# Patient Record
Sex: Male | Born: 1935 | Race: White | Hispanic: No | Smoking: Never smoker
Health system: Southern US, Community
[De-identification: ages and names within clinical notes are randomized; demographics above are authoritative.]

## PROBLEM LIST (undated history)

## (undated) DIAGNOSIS — N179 Acute kidney failure, unspecified: Secondary | ICD-10-CM

## (undated) DIAGNOSIS — I10 Essential (primary) hypertension: Secondary | ICD-10-CM

## (undated) DIAGNOSIS — C189 Malignant neoplasm of colon, unspecified: Secondary | ICD-10-CM

## (undated) DIAGNOSIS — C801 Malignant (primary) neoplasm, unspecified: Secondary | ICD-10-CM

## (undated) DIAGNOSIS — I5030 Unspecified diastolic (congestive) heart failure: Secondary | ICD-10-CM

## (undated) DIAGNOSIS — E7800 Pure hypercholesterolemia, unspecified: Secondary | ICD-10-CM

## (undated) HISTORY — DX: Unspecified diastolic (congestive) heart failure: I50.30

---

## 2003-05-11 HISTORY — PX: COLECTOMY: SHX59

## 2014-03-21 ENCOUNTER — Emergency Department (HOSPITAL_COMMUNITY): Payer: Medicare FFS

## 2014-03-21 ENCOUNTER — Encounter (HOSPITAL_COMMUNITY): Payer: Self-pay

## 2014-03-21 ENCOUNTER — Inpatient Hospital Stay (HOSPITAL_COMMUNITY)
Admission: EM | Admit: 2014-03-21 | Discharge: 2014-03-30 | DRG: 177 | Disposition: A | Discharge status: Skilled Nursing Facility | Payer: Medicare FFS | Attending: Internal Medicine | Admitting: Internal Medicine

## 2014-03-21 DIAGNOSIS — I615 Nontraumatic intracerebral hemorrhage, intraventricular: Secondary | ICD-10-CM | POA: Diagnosis not present

## 2014-03-21 DIAGNOSIS — K5900 Constipation, unspecified: Secondary | ICD-10-CM | POA: Diagnosis present

## 2014-03-21 DIAGNOSIS — R0602 Shortness of breath: Secondary | ICD-10-CM | POA: Diagnosis present

## 2014-03-21 DIAGNOSIS — J449 Chronic obstructive pulmonary disease, unspecified: Secondary | ICD-10-CM | POA: Diagnosis present

## 2014-03-21 DIAGNOSIS — Z85038 Personal history of other malignant neoplasm of large intestine: Secondary | ICD-10-CM

## 2014-03-21 DIAGNOSIS — D649 Anemia, unspecified: Secondary | ICD-10-CM | POA: Diagnosis present

## 2014-03-21 DIAGNOSIS — Z66 Do not resuscitate: Secondary | ICD-10-CM | POA: Diagnosis present

## 2014-03-21 DIAGNOSIS — F419 Anxiety disorder, unspecified: Secondary | ICD-10-CM | POA: Diagnosis present

## 2014-03-21 DIAGNOSIS — J90 Pleural effusion, not elsewhere classified: Secondary | ICD-10-CM

## 2014-03-21 DIAGNOSIS — J690 Pneumonitis due to inhalation of food and vomit: Secondary | ICD-10-CM | POA: Diagnosis present

## 2014-03-21 DIAGNOSIS — J9601 Acute respiratory failure with hypoxia: Secondary | ICD-10-CM | POA: Diagnosis present

## 2014-03-21 DIAGNOSIS — E876 Hypokalemia: Secondary | ICD-10-CM | POA: Diagnosis not present

## 2014-03-21 DIAGNOSIS — N183 Chronic kidney disease, stage 3 unspecified: Secondary | ICD-10-CM | POA: Diagnosis present

## 2014-03-21 DIAGNOSIS — I369 Nonrheumatic tricuspid valve disorder, unspecified: Secondary | ICD-10-CM

## 2014-03-21 DIAGNOSIS — I129 Hypertensive chronic kidney disease with stage 1 through stage 4 chronic kidney disease, or unspecified chronic kidney disease: Secondary | ICD-10-CM | POA: Diagnosis present

## 2014-03-21 DIAGNOSIS — D72829 Elevated white blood cell count, unspecified: Secondary | ICD-10-CM | POA: Diagnosis present

## 2014-03-21 DIAGNOSIS — E860 Dehydration: Secondary | ICD-10-CM | POA: Diagnosis present

## 2014-03-21 DIAGNOSIS — E780 Pure hypercholesterolemia: Secondary | ICD-10-CM | POA: Diagnosis present

## 2014-03-21 DIAGNOSIS — R531 Weakness: Secondary | ICD-10-CM

## 2014-03-21 DIAGNOSIS — I5033 Acute on chronic diastolic (congestive) heart failure: Secondary | ICD-10-CM | POA: Diagnosis present

## 2014-03-21 DIAGNOSIS — G9340 Encephalopathy, unspecified: Secondary | ICD-10-CM | POA: Diagnosis not present

## 2014-03-21 DIAGNOSIS — G8194 Hemiplegia, unspecified affecting left nondominant side: Secondary | ICD-10-CM | POA: Diagnosis present

## 2014-03-21 DIAGNOSIS — N19 Unspecified kidney failure: Secondary | ICD-10-CM

## 2014-03-21 DIAGNOSIS — Z9049 Acquired absence of other specified parts of digestive tract: Secondary | ICD-10-CM | POA: Diagnosis present

## 2014-03-21 DIAGNOSIS — D638 Anemia in other chronic diseases classified elsewhere: Secondary | ICD-10-CM | POA: Diagnosis present

## 2014-03-21 DIAGNOSIS — J189 Pneumonia, unspecified organism: Secondary | ICD-10-CM

## 2014-03-21 DIAGNOSIS — N179 Acute kidney failure, unspecified: Secondary | ICD-10-CM

## 2014-03-21 DIAGNOSIS — I10 Essential (primary) hypertension: Secondary | ICD-10-CM

## 2014-03-21 HISTORY — DX: Malignant neoplasm of colon, unspecified: C18.9

## 2014-03-21 HISTORY — DX: Essential (primary) hypertension: I10

## 2014-03-21 HISTORY — DX: Acute kidney failure, unspecified: N17.9

## 2014-03-21 HISTORY — DX: Pure hypercholesterolemia, unspecified: E78.00

## 2014-03-21 HISTORY — DX: Malignant (primary) neoplasm, unspecified: C80.1

## 2014-03-21 LAB — COMPREHENSIVE METABOLIC PANEL
ALT: 53 U/L (ref 0–53)
AST: 45 U/L — ABNORMAL HIGH (ref 0–37)
Albumin: 2.9 g/dL — ABNORMAL LOW (ref 3.5–5.2)
Alkaline Phosphatase: 237 U/L — ABNORMAL HIGH (ref 39–117)
Anion gap: 14 (ref 5–15)
BILIRUBIN TOTAL: 0.2 mg/dL — AB (ref 0.3–1.2)
BUN: 46 mg/dL — ABNORMAL HIGH (ref 6–23)
CHLORIDE: 103 meq/L (ref 96–112)
CO2: 26 meq/L (ref 19–32)
CREATININE: 2.56 mg/dL — AB (ref 0.50–1.35)
Calcium: 8.8 mg/dL (ref 8.4–10.5)
GFR, EST AFRICAN AMERICAN: 23 mL/min — AB (ref >90)
GFR, EST NON AFRICAN AMERICAN: 20 mL/min — AB (ref >90)
GLUCOSE: 144 mg/dL — AB (ref 70–99)
Potassium: 4.4 mEq/L (ref 3.7–5.3)
Sodium: 143 mEq/L (ref 137–147)
Total Protein: 7.2 g/dL (ref 6.0–8.3)

## 2014-03-21 LAB — TROPONIN I
Troponin I: <0.3 ng/mL (ref <0.30)
Troponin I: <0.3 ng/mL (ref <0.30)

## 2014-03-21 LAB — URINALYSIS, ROUTINE W REFLEX MICROSCOPIC
Bilirubin Urine: NEGATIVE
Glucose, UA: NEGATIVE mg/dL
Ketones, ur: NEGATIVE mg/dL
LEUKOCYTES UA: NEGATIVE
NITRITE: NEGATIVE
PROTEIN: 100 mg/dL — AB
SPECIFIC GRAVITY, URINE: 1.025 (ref 1.005–1.030)
UROBILINOGEN UA: 0.2 mg/dL (ref 0.0–1.0)
pH: 5.5 (ref 5.0–8.0)

## 2014-03-21 LAB — CBC WITH DIFFERENTIAL/PLATELET
BASOS ABS: 0.1 10*3/uL (ref 0.0–0.1)
Basophils Absolute: 0.1 10*3/uL (ref 0.0–0.1)
Basophils Relative: 0 % (ref 0–1)
Basophils Relative: 0 % (ref 0–1)
EOS ABS: 0 10*3/uL (ref 0.0–0.7)
EOS PCT: 0 % (ref 0–5)
EOS PCT: 0 % (ref 0–5)
Eosinophils Absolute: 0 10*3/uL (ref 0.0–0.7)
HCT: 36.5 % — ABNORMAL LOW (ref 39.0–52.0)
HCT: 35 % — ABNORMAL LOW (ref 39.0–52.0)
Hemoglobin: 11.4 g/dL — ABNORMAL LOW (ref 13.0–17.0)
Hemoglobin: 11 g/dL — ABNORMAL LOW (ref 13.0–17.0)
LYMPHS ABS: 1.1 10*3/uL (ref 0.7–4.0)
LYMPHS ABS: 0.5 10*3/uL — AB (ref 0.7–4.0)
LYMPHS PCT: 8 % — AB (ref 12–46)
Lymphocytes Relative: 4 % — ABNORMAL LOW (ref 12–46)
MCH: 25.1 pg — AB (ref 26.0–34.0)
MCH: 25.2 pg — AB (ref 26.0–34.0)
MCHC: 31.2 g/dL (ref 30.0–36.0)
MCHC: 31.4 g/dL (ref 30.0–36.0)
MCV: 80.2 fL (ref 78.0–100.0)
MCV: 80.3 fL (ref 78.0–100.0)
MONO ABS: 0.7 10*3/uL (ref 0.1–1.0)
Monocytes Absolute: 0.8 10*3/uL (ref 0.1–1.0)
Monocytes Relative: 5 % (ref 3–12)
Monocytes Relative: 6 % (ref 3–12)
NEUTROS PCT: 90 % — AB (ref 43–77)
Neutro Abs: 12.2 10*3/uL — ABNORMAL HIGH (ref 1.7–7.7)
Neutro Abs: 12.3 10*3/uL — ABNORMAL HIGH (ref 1.7–7.7)
Neutrophils Relative %: 87 % — ABNORMAL HIGH (ref 43–77)
PLATELETS: 358 10*3/uL (ref 150–400)
Platelets: 310 10*3/uL (ref 150–400)
RBC: 4.55 MIL/uL (ref 4.22–5.81)
RBC: 4.36 MIL/uL (ref 4.22–5.81)
RDW: 16.9 % — ABNORMAL HIGH (ref 11.5–15.5)
RDW: 17 % — AB (ref 11.5–15.5)
WBC: 14.1 10*3/uL — ABNORMAL HIGH (ref 4.0–10.5)
WBC: 13.6 10*3/uL — ABNORMAL HIGH (ref 4.0–10.5)

## 2014-03-21 LAB — URINE MICROSCOPIC-ADD ON

## 2014-03-21 LAB — BASIC METABOLIC PANEL
Anion gap: 14 (ref 5–15)
BUN: 46 mg/dL — ABNORMAL HIGH (ref 6–23)
CALCIUM: 8.9 mg/dL (ref 8.4–10.5)
CO2: 27 mEq/L (ref 19–32)
CREATININE: 2.56 mg/dL — AB (ref 0.50–1.35)
Chloride: 100 mEq/L (ref 96–112)
GFR calc Af Amer: 23 mL/min — ABNORMAL LOW (ref >90)
GFR calc non Af Amer: 20 mL/min — ABNORMAL LOW (ref >90)
GLUCOSE: 132 mg/dL — AB (ref 70–99)
Potassium: 4.6 mEq/L (ref 3.7–5.3)
Sodium: 141 mEq/L (ref 137–147)

## 2014-03-21 LAB — TSH: TSH: 4.02 u[IU]/mL (ref 0.350–4.500)

## 2014-03-21 LAB — PRO B NATRIURETIC PEPTIDE: PRO B NATRI PEPTIDE: 2695 pg/mL — AB (ref 0–450)

## 2014-03-21 LAB — HIV ANTIBODY (ROUTINE TESTING W REFLEX): HIV: NONREACTIVE

## 2014-03-21 LAB — STREP PNEUMONIAE URINARY ANTIGEN: STREP PNEUMO URINARY ANTIGEN: NEGATIVE

## 2014-03-21 MED ORDER — SERTRALINE HCL 50 MG PO TABS
50.0000 mg | ORAL_TABLET | Freq: Every day | ORAL | Status: DC
  Administered 2014-03-21 – 2014-03-30 (×10): 50 mg via ORAL
  Filled 2014-03-21 (×12): qty 1

## 2014-03-21 MED ORDER — DEXTROSE 5 % IV SOLN
500.0000 mg | INTRAVENOUS | Status: DC
  Administered 2014-03-22 – 2014-03-23 (×2): 500 mg via INTRAVENOUS
  Filled 2014-03-21 (×4): qty 500

## 2014-03-21 MED ORDER — NITROGLYCERIN 2 % TD OINT
1.0000 [in_us] | TOPICAL_OINTMENT | Freq: Once | TRANSDERMAL | Status: AC
  Administered 2014-03-21: 1 [in_us] via TOPICAL
  Filled 2014-03-21: qty 1

## 2014-03-21 MED ORDER — HEPARIN SODIUM (PORCINE) 5000 UNIT/ML IJ SOLN
5000.0000 [IU] | Freq: Three times a day (TID) | INTRAMUSCULAR | Status: DC
  Administered 2014-03-21 – 2014-03-27 (×20): 5000 [IU] via SUBCUTANEOUS
  Filled 2014-03-21 (×18): qty 1

## 2014-03-21 MED ORDER — LORATADINE 10 MG PO TABS
10.0000 mg | ORAL_TABLET | Freq: Every day | ORAL | Status: DC
Start: 2014-03-21 — End: 2014-03-30
  Administered 2014-03-21 – 2014-03-30 (×10): 10 mg via ORAL
  Filled 2014-03-21 (×10): qty 1

## 2014-03-21 MED ORDER — HYDROCHLOROTHIAZIDE 12.5 MG PO CAPS
12.5000 mg | ORAL_CAPSULE | Freq: Every day | ORAL | Status: DC
  Administered 2014-03-21 – 2014-03-23 (×3): 12.5 mg via ORAL
  Filled 2014-03-21 (×5): qty 1

## 2014-03-21 MED ORDER — LOSARTAN POTASSIUM-HCTZ 100-12.5 MG PO TABS: 1.0000 | ORAL_TABLET | Freq: Every day | ORAL | Status: DC

## 2014-03-21 MED ORDER — AZITHROMYCIN 500 MG IV SOLR
500.0000 mg | INTRAVENOUS | Status: DC
  Administered 2014-03-21: 500 mg via INTRAVENOUS

## 2014-03-21 MED ORDER — PANTOPRAZOLE SODIUM 40 MG PO TBEC
40.0000 mg | DELAYED_RELEASE_TABLET | Freq: Every day | ORAL | Status: DC
  Administered 2014-03-21 – 2014-03-30 (×10): 40 mg via ORAL
  Filled 2014-03-21 (×10): qty 1

## 2014-03-21 MED ORDER — DEXTROSE 5 % IV SOLN: 1.0000 g | INTRAVENOUS | Status: DC

## 2014-03-21 MED ORDER — DEXTROSE 5 % IV SOLN
INTRAVENOUS | Status: AC
  Filled 2014-03-21: qty 10

## 2014-03-21 MED ORDER — SODIUM CHLORIDE 0.9 % IV SOLN
250.0000 mL | INTRAVENOUS | Status: DC | PRN
Start: 2014-03-21 — End: 2014-03-30

## 2014-03-21 MED ORDER — IPRATROPIUM-ALBUTEROL 0.5-2.5 (3) MG/3ML IN SOLN
RESPIRATORY_TRACT | Status: AC
  Administered 2014-03-21: 3 mL
  Filled 2014-03-21: qty 3

## 2014-03-21 MED ORDER — LORAZEPAM 0.5 MG PO TABS
0.5000 mg | ORAL_TABLET | Freq: Once | ORAL | Status: AC | PRN
  Filled 2014-03-21 (×2): qty 1

## 2014-03-21 MED ORDER — IPRATROPIUM-ALBUTEROL 0.5-2.5 (3) MG/3ML IN SOLN
3.0000 mL | Freq: Four times a day (QID) | RESPIRATORY_TRACT | Status: DC
  Administered 2014-03-21 – 2014-03-25 (×14): 3 mL via RESPIRATORY_TRACT
  Filled 2014-03-21 (×14): qty 3

## 2014-03-21 MED ORDER — ATORVASTATIN CALCIUM 20 MG PO TABS: 20.0000 mg | ORAL_TABLET | Freq: Every day | ORAL | Status: DC

## 2014-03-21 MED ORDER — SODIUM CHLORIDE 0.9 % IJ SOLN: 3.0000 mL | INTRAMUSCULAR | Status: DC | PRN

## 2014-03-21 MED ORDER — SODIUM CHLORIDE 0.9 % IJ SOLN
3.0000 mL | Freq: Two times a day (BID) | INTRAMUSCULAR | Status: DC
  Administered 2014-03-22 – 2014-03-30 (×8): 3 mL via INTRAVENOUS

## 2014-03-21 MED ORDER — CETYLPYRIDINIUM CHLORIDE 0.05 % MT LIQD
7.0000 mL | Freq: Two times a day (BID) | OROMUCOSAL | Status: DC
  Administered 2014-03-21 – 2014-03-29 (×16): 7 mL via OROMUCOSAL

## 2014-03-21 MED ORDER — ASPIRIN 81 MG PO CHEW
81.0000 mg | CHEWABLE_TABLET | Freq: Every day | ORAL | Status: DC
Start: 2014-03-21 — End: 2014-03-27
  Administered 2014-03-21 – 2014-03-27 (×7): 81 mg via ORAL
  Filled 2014-03-21 (×7): qty 1

## 2014-03-21 MED ORDER — LORAZEPAM 0.5 MG PO TABS: 0.5000 mg | ORAL_TABLET | Freq: Once | ORAL | Status: DC

## 2014-03-21 MED ORDER — ALBUTEROL SULFATE (2.5 MG/3ML) 0.083% IN NEBU
INHALATION_SOLUTION | RESPIRATORY_TRACT | Status: AC
  Administered 2014-03-21: 2.5 mg
  Filled 2014-03-21: qty 3

## 2014-03-21 MED ORDER — GUAIFENESIN ER 600 MG PO TB12
600.0000 mg | ORAL_TABLET | Freq: Two times a day (BID) | ORAL | Status: DC
  Administered 2014-03-21 – 2014-03-30 (×19): 600 mg via ORAL
  Filled 2014-03-21 (×19): qty 1

## 2014-03-21 MED ORDER — DEXTROSE 5 % IV SOLN
500.0000 mg | INTRAVENOUS | Status: DC
  Administered 2014-03-21: 500 mg via INTRAVENOUS
  Filled 2014-03-21: qty 500

## 2014-03-21 MED ORDER — SODIUM CHLORIDE 0.9 % IJ SOLN
3.0000 mL | Freq: Two times a day (BID) | INTRAMUSCULAR | Status: DC
  Administered 2014-03-21 – 2014-03-26 (×5): 3 mL via INTRAVENOUS

## 2014-03-21 MED ORDER — DEXTROSE 5 % IV SOLN
1.0000 g | Freq: Every day | INTRAVENOUS | Status: DC
  Administered 2014-03-21 – 2014-03-23 (×3): 1 g via INTRAVENOUS
  Filled 2014-03-21 (×4): qty 10

## 2014-03-21 MED ORDER — ATORVASTATIN CALCIUM 20 MG PO TABS
20.0000 mg | ORAL_TABLET | Freq: Every day | ORAL | Status: DC
  Administered 2014-03-21 – 2014-03-29 (×9): 20 mg via ORAL
  Filled 2014-03-21 (×10): qty 1

## 2014-03-21 MED ORDER — FUROSEMIDE 10 MG/ML IJ SOLN
20.0000 mg | Freq: Once | INTRAMUSCULAR | Status: AC
  Administered 2014-03-21: 20 mg via INTRAVENOUS
  Filled 2014-03-21: qty 2

## 2014-03-21 MED ORDER — DEXTROSE 5 % IV SOLN: 1.0000 g | Freq: Once | INTRAVENOUS | Status: DC

## 2014-03-21 MED ORDER — LOSARTAN POTASSIUM 50 MG PO TABS
100.0000 mg | ORAL_TABLET | Freq: Every day | ORAL | Status: DC
  Administered 2014-03-21 – 2014-03-23 (×3): 100 mg via ORAL
  Filled 2014-03-21 (×5): qty 2

## 2014-03-21 NOTE — ED Notes (Signed)
Pt's son and daughter here. They state that he's only been any hospital 3 times in his life and that his feet have been swollen even more than they are now, "for years".

## 2014-03-21 NOTE — Progress Notes (Signed)
[facility] PROGRESS NOTE  Corey Cain IRS:[id number] DOB: Oct 29, 1914 DOA: 03/21/2014 PCP: No primary care provider on file.  Assessment/Plan: 1. Acute respiratory failure with hypoxia secondary to questionable pneumonia vs ? Chf.  He was admitted to telemetry and starte don IV antibiotics empirically for pneumonia. Cultures are pending. He is being evaluated for CHF with echocardiogram. Cardiac enzymes are negative and EKG does not show any ischemic changes. Continue nasal canula oxygen to keep sats>90%. Urine strep negative.   2. Renal failure/ ? Acute vs CKD: UA, and US renal ordered. A dose of lasix given earlier today. Would watchrenal parameters in am before giving another dose of lasix.    Hypertension controlled.   Leukocytosis: possibly from the pneumonia.   Mild anemia: Normocytic. Probably anemia of chronic disease.   Code Status: full code Family Communication: family at bedside Disposition Plan: pending.    Consultants:  none  Procedures:  echocardiogram  Antibiotics:  Rocephin 11/12  zithromax 11/12  HPI/Subjective: He feels comfortable and denies any chest pain or sob. Sleeping. Son at bedside  Objective: Filed Vitals:   03/21/14 1709  BP:   Pulse: 90  Temp:   Resp:     Intake/Output Summary (Last 24 hours) at 03/21/14 1725 Last data filed at 03/21/14 1200  Gross per 24 hour  Intake    240 ml  Output    600 ml  Net   -360 ml   Filed Weights   03/21/14 0500  Weight: 79.924 kg (176 lb 3.2 oz)    Exam:   General:  Alert afebrile comfortablesitting in the chair on 1 lit [place] oxygen.  Cardiovascular: s1s2  Respiratory: chest clear to auscultation , no wheezing heard.   Abdomen: soft non tender non distended bowel sounds heard  Musculoskeletal: pedal edema +1  Data Reviewed: Basic Metabolic Panel:  Recent Labs Lab 03/21/14 0228 03/21/14 0500  NA 141 143  K 4.6 4.4  CL 100 103  CO2 27 26  GLUCOSE 132* 144*  BUN 46*  46*  CREATININE 2.56* 2.56*  CALCIUM 8.9 8.8   Liver Function Tests:  Recent Labs Lab 03/21/14 0500  AST 45*  ALT 53  ALKPHOS 237*  BILITOT 0.2*  PROT 7.2  ALBUMIN 2.9*   No results for input(s): LIPASE, AMYLASE in the last 168 hours. No results for input(s): AMMONIA in the last 168 hours. CBC:  Recent Labs Lab 03/21/14 0228 03/21/14 0500  WBC 14.1* 13.6*  NEUTROABS 12.2* 12.3*  HGB 11.4* 11.0*  HCT 36.5* 35.0*  MCV 80.2 80.3  PLT 310 358   Cardiac Enzymes:  Recent Labs Lab 03/21/14 0228 03/21/14 0500 03/21/14 0936 03/21/14 1548  TROPONINI <0.30 <0.30 <0.30 <0.30   BNP (last 3 results)  Recent Labs  03/21/14 0228  PROBNP 2695.0*   CBG: No results for input(s): GLUCAP in the last 168 hours.  Recent Results (from the past 240 hour(s))  Culture, blood (routine x 2)     Status: None (Preliminary result)   Collection Time: 03/21/14  3:30 AM  Result Value Ref Range Status   Specimen Description BLOOD RIGHT ANTECUBITAL  Final   Special Requests BOTTLES DRAWN AEROBIC AND ANAEROBIC 10CC EACH  Final   Culture NO GROWTH <24 HRS  Final   Report Status PENDING  Incomplete  Culture, blood (routine x 2)     Status: None (Preliminary result)   Collection Time: 03/21/14  3:35 AM  Result Value Ref Range Status   Specimen Description BLOOD LEFT ANTECUBITAL  Final   Special Requests   Final    BOTTLES DRAWN AEROBIC AND ANAEROBIC AEB 4CC ANA 2CC   Culture NO GROWTH <24 HRS  Final   Report Status PENDING  Incomplete     Studies: Dg Chest 2 View  03/21/2014   CLINICAL DATA:  Cough for 1 week and congestion for 1 day. Shortness of breath and wheezing tonight.  EXAM: CHEST  2 VIEW  COMPARISON:  None.  FINDINGS: There is elevation of the right hemidiaphragm relative to the left. The patient has a small left pleural effusion with basilar atelectasis. The right lung is clear. No pneumothorax is identified. Heart size is upper normal. There is a fracture of the posterior  arc of the left seventh rib which is age indeterminate.  IMPRESSION: Small left pleural effusion and basilar airspace disease, likely atelectasis.  Age indeterminate left seventh rib fracture.   Electronically Signed   By: Inge Rise M.D.   On: 03/21/2014 03:04    Scheduled Meds: . antiseptic oral rinse  7 mL Mouth Rinse BID  . aspirin  81 mg Oral Daily  . atorvastatin  20 mg Oral q1800  . [START ON 03/22/2014] azithromycin  500 mg Intravenous Q24H  . cefTRIAXone (ROCEPHIN)  IV  1 g Intravenous Q0600  . guaiFENesin  600 mg Oral BID  . heparin  5,000 Units Subcutaneous 3 times per day  . losartan  100 mg Oral Daily   And  . hydrochlorothiazide  12.5 mg Oral Daily  . ipratropium-albuterol  3 mL Nebulization Q6H  . loratadine  10 mg Oral Daily  . pantoprazole  40 mg Oral Daily  . sertraline  50 mg Oral Daily  . sodium chloride  3 mL Intravenous Q12H  . sodium chloride  3 mL Intravenous Q12H   Continuous Infusions:   Active Problems:   Acute respiratory failure with hypoxia   AKI (acute kidney injury)    Time spent: 30 min    Margorie Renner  [facility] Pager [phone] If 7PM-7AM, please contact night-coverage at [email or web address], password [facility] 03/21/2014, 5:25 PM  LOS: 0 days

## 2014-03-21 NOTE — Progress Notes (Signed)
  Echocardiogram 2D Echocardiogram has been performed.  Corey Cain 03/21/2014, 12:19 PM

## 2014-03-21 NOTE — Progress Notes (Signed)
UR completed 

## 2014-03-21 NOTE — Progress Notes (Signed)
Patients telemetry monitor is now picking up properly.

## 2014-03-21 NOTE — Progress Notes (Signed)
Pt is experiencing agitation. Family states ativan does not work well for the patient. They have arranged for a personal sitter to be with pt all night and he seems to be more cooperative and calm.

## 2014-03-21 NOTE — ED Provider Notes (Signed)
CSN: [id number]     Arrival date & time 03/21/14  0202 History   First MD Initiated Contact with Patient 03/21/14 [phone]     Chief Complaint  Patient presents with  . Wheezing     (Consider location/radiation/quality/duration/timing/severity/associated sxs/prior Treatment) HPI  This is a very pleasant 80 year old male who presents by EMS with shortness of breath and cough. Patient denies any significant past medical history. Reports 5-6 days of worsening cough. Cough has become productive over the last day. Patient and family report that patient acutely worsened overnight. He became more short of breath when lying flat and his breathing was noted to be more labored. EMS was called. He was noted to be wheezing and was given a DuoNeb and round. No history of smoking or COPD.  Patient denies any recent fevers. No known history of heart failure or coronary artery disease.  Lower extremity edema is at patient's baseline per family report.  In discussions with family and patient, he wishes to be full code.  Primary doctor is Dr. Simonne Martinet.  Patient gets most of his medical care in [place] but family history physician in care to [place]. Past Medical History  Diagnosis Date  . Cancer   . Colon cancer approx 2005    colon resection with resolution   No past surgical history on file. No family history on file. History  Substance Use Topics  . Smoking status: Never Smoker   . Smokeless tobacco: Not on file  . Alcohol Use: No    Review of Systems  Constitutional: Negative.  Negative for fever.  Respiratory: Positive for cough and shortness of breath. Negative for chest tightness.   Cardiovascular: Positive for leg swelling. Negative for chest pain.  Gastrointestinal: Negative.  Negative for abdominal pain.  Genitourinary: Negative.  Negative for dysuria.  Musculoskeletal: Negative for back pain.  Neurological: Negative for headaches.  All other systems reviewed and are  negative.     Allergies  Review of patient's allergies indicates no known allergies.  Home Medications   Prior to Admission medications   Medication Sig Start Date End Date Taking? Authorizing Provider  aspirin 81 MG tablet Take 81 mg by mouth daily.   Yes Historical Provider, MD  atorvastatin (LIPITOR) 20 MG tablet Take 20 mg by mouth daily.   Yes Historical Provider, MD  benzonatate (TESSALON) 200 MG capsule Take 200 mg by mouth 3 (three) times daily as needed for cough.   Yes Historical Provider, MD  loratadine (CLARITIN) 10 MG tablet Take 10 mg by mouth daily.   Yes Historical Provider, MD  losartan-hydrochlorothiazide (HYZAAR) 100-12.5 MG per tablet Take 1 tablet by mouth daily.   Yes Historical Provider, MD  pantoprazole (PROTONIX) 40 MG tablet Take 40 mg by mouth daily.   Yes Historical Provider, MD  sertraline (ZOLOFT) 50 MG tablet Take 50 mg by mouth daily.   Yes Historical Provider, MD   BP 157/61 mmHg  Pulse 95  Temp(Src) 98.4 F (36.9 C) (Oral)  Resp 24  SpO2 95% Physical Exam  Constitutional: He is oriented to person, place, and time.  Appears younger than stated age  HENT:  Head: Normocephalic and atraumatic.  Mouth/Throat: Oropharynx is clear and moist.  Eyes: Pupils are equal, round, and reactive to light.  Cardiovascular: Normal rate, regular rhythm and normal heart sounds.   No murmur heard. Pulmonary/Chest: Effort normal. No respiratory distress. He has wheezes.  Fair air movement, diffuse expiratory wheezing, fine crackles at the bases  Abdominal: Soft. Bowel  sounds are normal. There is no tenderness. There is no rebound.  Musculoskeletal: He exhibits edema.  2+ bilateral lower extremity edema, symmetric  Neurological: He is alert and oriented to person, place, and time.  Skin: Skin is warm and dry.  Psychiatric: He has a normal mood and affect.  Nursing note and vitals reviewed.   ED Course  Procedures (including critical care time) Labs  Review Labs Reviewed  CBC WITH DIFFERENTIAL - Abnormal; Notable for the following:    WBC 14.1 (*)    Hemoglobin 11.4 (*)    HCT 36.5 (*)    MCH 25.1 (*)    RDW 16.9 (*)    Neutrophils Relative % 87 (*)    Neutro Abs 12.2 (*)    Lymphocytes Relative 8 (*)    All other components within normal limits  BASIC METABOLIC PANEL - Abnormal; Notable for the following:    Glucose, Bld 132 (*)    BUN 46 (*)    Creatinine, Ser 2.56 (*)    GFR calc non Af Amer 20 (*)    GFR calc Af Amer 23 (*)    All other components within normal limits  PRO B NATRIURETIC PEPTIDE - Abnormal; Notable for the following:    Pro B Natriuretic peptide (BNP) 2695.0 (*)    All other components within normal limits  CULTURE, BLOOD (ROUTINE X 2)  CULTURE, BLOOD (ROUTINE X 2)  TROPONIN I    Imaging Review Dg Chest 2 View  03/21/2014   CLINICAL DATA:  Cough for 1 week and congestion for 1 day. Shortness of breath and wheezing tonight.  EXAM: CHEST  2 VIEW  COMPARISON:  None.  FINDINGS: There is elevation of the right hemidiaphragm relative to the left. The patient has a small left pleural effusion with basilar atelectasis. The right lung is clear. No pneumothorax is identified. Heart size is upper normal. There is a fracture of the posterior arc of the left seventh rib which is age indeterminate.  IMPRESSION: Small left pleural effusion and basilar airspace disease, likely atelectasis.  Age indeterminate left seventh rib fracture.   Electronically Signed   By: Inge Rise M.D.   On: 03/21/2014 03:04     EKG Interpretation   Date/Time:  Thursday March 21 2014 02:11:17 EST Ventricular Rate:  91 PR Interval:  168 QRS Duration: 143 QT Interval:  438 QTC Calculation: 539 R Axis:   -71 Text Interpretation:  Sinus rhythm RBBB and LAFB No prior for comparison  Confirmed by Gerardo Territo  MD, Aleiah Mohammed ([id number]) on 03/21/2014 2:22:29 AM      MDM   Final diagnoses:  Shortness of breath  Pleural effusion  Acute  kidney injury   Patient presents with shortness of breath, cough, and wheezing. Acutely worsened overnight and symptoms associated with lying flat. Appears somewhat volume overloaded on exam with bilateral lower extremity edema. He is also wheezing with no history of COPD or tobacco use. No significant improvement with DuoNeb. Suspect volume overload as culprit of wheezing. Basic labwork obtained. X-ray shows small left pleural effusion and basilar airspace disease. Basic lab work notable for leukocytosis to 14, creatinine of 2.56 with unknown baseline, and BNP of 2695.  Troponin is negative. EKG shows right bundle branch block and left anterior fascicular block with no prior for comparison. Patient not currently having any chest pain.  Given leukocytosis and pleural effusion, will cover for community-acquired pneumonia with azithromycin and Rocephin. Blood culture sent. Patient also given 20 mg of IV  Lasix as I suspect volume overload/heart failure as the cause of patient's wheezing. Given patient's overall clinical picture, will admit for further workup. Suspect patient may need an echocardiogram to evaluate for heart failure. He will need close monitoring of kidney function as well if he needs further diuresis.  Discussed with Dr. Ernestina Patches. Appreciate his consult.   Merryl Hacker, MD 03/21/14 [phone]

## 2014-03-21 NOTE — H&P (Signed)
Hospitalist Admission History and Physical  Patient name: Corey Cain Medical record number: [id number] Date of birth: June 02, 1914 Age: 80 y.o. Gender: male  Primary Care Provider: No primary care provider on file.  Chief Complaint: acute resp failure w/ hypoxia, AKI   History of Present Illness:This is a 80 y.o. year old male with significant past medical history of colon ca s/p colectomy 2005, HTN presenting with acute resp failure w/ hypoxia. Per family, pt has had increased WOB, cough, wheezing at home over past 4-5 days. No fevers or chills. No nausea or vomiting. 1 sick contact with similar sxs. Cough mildly productive. Intermittent orthopnea. Trace edema. Pt primarily gets his care in [place]. Family is in process of transitioning care here. Family denies any hx/o CHF in the past. Denies excess salt intake.  On presentation to the ER t 98.4, HR 90s, resp 20s, BP 157/61, Satting low 90s/upper 80s on RA. > 95% on 2L. WBC 14.1, hgb 11.4, Cr 2.56, BUN 46.  Pro BNP 2600. EKG sinus rhythm w/ RBBB. CXR Small left pleural effusion and basilar airspace disease, likely atelectasis. Age indeterminate left seventh rib fracture. Given low dose lasix x1. Started on rocephin and azithromycin. Was given albuterol for ? Wheezing. No real change in resp status.    Assessment and Plan: Corey Cain is a 80 y.o. year old male presenting with Acute resp failure w/ hypoxia, AKI   Active Problems:   Acute respiratory failure with hypoxia   AKI (acute kidney injury)   1- Acute resp failure w/ hypoxia -? Multifactorial etiology (CHF vs. CAP) -low dose lasix x1 -follow UOP -2D ECHO  -+ leukocytosis -CAP treatment w/ rocephin and azithro -pan culture -cont supplemental O2 -tele bed   2- AKI  -unclear if this is baseline -family reports pt chronically dehydrated -f/u Cr s/p low diuresis -strict Is and Os and daily weights  -reasess  3- HTN -BP stable  -cont home regimen   FEN/GI: heart  healthy diet  Prophylaxis: sub q heparin  Disposition: pending further evaluation  Code Status:Full Code    Patient Active Problem List   Diagnosis Date Noted  . Acute respiratory failure with hypoxia 03/21/2014   Past Medical History: Past Medical History  Diagnosis Date  . Cancer   . Colon cancer approx 2005    colon resection with resolution    Past Surgical History: No past surgical history on file.  Social History: History   Social History  . Marital Status: Widowed    Spouse Name: N/A    Number of Children: N/A  . Years of Education: N/A   Social History Main Topics  . Smoking status: Never Smoker   . Smokeless tobacco: Not on file  . Alcohol Use: No  . Drug Use: Not on file  . Sexual Activity: Not on file   Other Topics Concern  . Not on file   Social History Narrative  . No narrative on file    Family History: No family history on file.  Allergies: No Known Allergies  Current Facility-Administered Medications  Medication Dose Route Frequency Provider Last Rate Last Dose  . 0.9 %  sodium chloride infusion  250 mL Intravenous PRN Shanda Howells, MD      . aspirin chewable tablet 81 mg  81 mg Oral Daily Shanda Howells, MD      . atorvastatin (LIPITOR) tablet 20 mg  20 mg Oral Daily Shanda Howells, MD      . azithromycin (ZITHROMAX) 500 mg in  dextrose 5 % 250 mL IVPB  500 mg Intravenous Q24H Shanda Howells, MD      . cefTRIAXone (ROCEPHIN) 1 g in dextrose 5 % 50 mL IVPB  1 g Intravenous Once Merryl Hacker, MD      . cefTRIAXone (ROCEPHIN) 1 g in dextrose 5 % 50 mL IVPB  1 g Intravenous Q24H Shanda Howells, MD      . furosemide (LASIX) injection 20 mg  20 mg Intravenous Once Merryl Hacker, MD      . heparin injection 5,000 Units  5,000 Units Subcutaneous 3 times per day Shanda Howells, MD      . loratadine (CLARITIN) tablet 10 mg  10 mg Oral Daily Shanda Howells, MD      . losartan-hydrochlorothiazide (HYZAAR) 100-12.5 MG per tablet 1 tablet  1 tablet  Oral Daily Shanda Howells, MD      . nitroGLYCERIN (NITROGLYN) 2 % ointment 1 inch  1 inch Topical Once Merryl Hacker, MD      . pantoprazole (PROTONIX) EC tablet 40 mg  40 mg Oral Daily Shanda Howells, MD      . sertraline (ZOLOFT) tablet 50 mg  50 mg Oral Daily Shanda Howells, MD      . sodium chloride 0.9 % injection 3 mL  3 mL Intravenous Q12H Shanda Howells, MD      . sodium chloride 0.9 % injection 3 mL  3 mL Intravenous Q12H Shanda Howells, MD      . sodium chloride 0.9 % injection 3 mL  3 mL Intravenous PRN Shanda Howells, MD       Current Outpatient Prescriptions  Medication Sig Dispense Refill  . aspirin 81 MG tablet Take 81 mg by mouth daily.    Marland Kitchen atorvastatin (LIPITOR) 20 MG tablet Take 20 mg by mouth daily.    . benzonatate (TESSALON) 200 MG capsule Take 200 mg by mouth 3 (three) times daily as needed for cough.    . loratadine (CLARITIN) 10 MG tablet Take 10 mg by mouth daily.    Marland Kitchen losartan-hydrochlorothiazide (HYZAAR) 100-12.5 MG per tablet Take 1 tablet by mouth daily.    . pantoprazole (PROTONIX) 40 MG tablet Take 40 mg by mouth daily.    . sertraline (ZOLOFT) 50 MG tablet Take 50 mg by mouth daily.     Review Of Systems: 12 point ROS negative except as noted above in HPI.  Physical Exam: Filed Vitals:   03/21/14 0212  BP: 157/61  Pulse: 95  Temp: 98.4 F (36.9 C)  Resp: 24    General: cooperative and mild distress HEENT: PERRLA and extra ocular movement intact Heart: S1, S2 normal, no murmur, rub or gallop, regular rate and rhythm Lungs: mild wheezes and rales  Abdomen: abdomen is soft without significant tenderness, masses, organomegaly or guarding Extremities: 2+ peripheral pulses, trace edema bilaterally Skin:no rashes, no ecchymoses Neurology: normal without focal findings  Labs and Imaging: Lab Results  Component Value Date/Time   NA 141 03/21/2014 02:28 AM   K 4.6 03/21/2014 02:28 AM   CL 100 03/21/2014 02:28 AM   CO2 27 03/21/2014 02:28 AM   BUN  46* 03/21/2014 02:28 AM   CREATININE 2.56* 03/21/2014 02:28 AM   GLUCOSE 132* 03/21/2014 02:28 AM   Lab Results  Component Value Date   WBC 14.1* 03/21/2014   HGB 11.4* 03/21/2014   HCT 36.5* 03/21/2014   MCV 80.2 03/21/2014   PLT 310 03/21/2014    Dg Chest 2 View  03/21/2014  CLINICAL DATA:  Cough for 1 week and congestion for 1 day. Shortness of breath and wheezing tonight.  EXAM: CHEST  2 VIEW  COMPARISON:  None.  FINDINGS: There is elevation of the right hemidiaphragm relative to the left. The patient has a small left pleural effusion with basilar atelectasis. The right lung is clear. No pneumothorax is identified. Heart size is upper normal. There is a fracture of the posterior arc of the left seventh rib which is age indeterminate.  IMPRESSION: Small left pleural effusion and basilar airspace disease, likely atelectasis.  Age indeterminate left seventh rib fracture.   Electronically Signed   By: Inge Rise M.D.   On: 03/21/2014 03:04           Shanda Howells MD  Pager: [phone]

## 2014-03-21 NOTE — Progress Notes (Signed)
Patients telemetry was set up initially and was being picked up on the monitors.  [facility] is currently having technical difficulties and patient is not picking up on the monitors.  Called [facility] and notified them, was assured that they were working on it.  Will continue to monitor the patient until problem is resolved.

## 2014-03-21 NOTE — Plan of Care (Signed)
Problem: Consults Goal: Heart Failure Patient Education (See Patient Education module for education specifics.)  Outcome: Progressing Goal: Skin Care Protocol Initiated - if Braden Score 18 or less If consults are not indicated, leave blank or document N/A  Outcome: Not Applicable Date Met:  53/79/43 Goal: Tobacco Cessation referral if indicated Outcome: Not Applicable Date Met:  27/61/47 Goal: Nutrition Consult-if indicated Outcome: Not Applicable Date Met:  02/06/56 Goal: Diabetes Guidelines if Diabetic/Glucose > 140 If diabetic or lab glucose is > 140 mg/dl - Initiate Diabetes/Hyperglycemia Guidelines & Document Interventions  Outcome: Not Applicable Date Met:  47/34/03

## 2014-03-21 NOTE — Care Management Note (Addendum)
    Page 1 of 1   03/29/2014     2:40:10 PM CARE MANAGEMENT NOTE 03/29/2014  Patient:  Corey Cain, Corey Cain   Account Number:  [id number]  Date Initiated:  03/21/2014  Documentation initiated by:  Theophilus Kinds  Subjective/Objective Assessment:   Pt admitted from home with respiratory failure. Pt lives alone but pts children take turns staying with pt. Pt also has some private duty sitters. Pt has a walker for home use.     Action/Plan:   PT consult placed. Will continue to follow for discharge planning needs.   Anticipated DC Date:  03/25/2014   Anticipated DC Plan:  [place]  CM consult      Choice offered to / List presented to:             Status of service:  In process, will continue to follow Medicare Important Message given?  YES (If response is "NO", the following Medicare IM given date fields will be blank) Date Medicare IM given:  03/22/2014 Medicare IM given by:  Christinia Gully C Date Additional Medicare IM given:  03/28/2014 Additional Medicare IM given by:  Theophilus Kinds  Discharge Disposition:    Per UR Regulation:    If discussed at Long Length of Stay Meetings, dates discussed:   03/28/2014    Comments:  03/29/14 [place], RN BSN CM Pts condition has stablized a little. The family has decided to hold off on Hospice at this time. Anticipate discharge to [facility] 04/08/2014. CSW to arrange discharge to facility.  03/28/14 [place], RN BSN CM Pt condition deteriorating. Family wants to speak with someone with [facility] [place]. Family is considering [facility]. Hospice to come speak with family at 3 pm today. CSW will arrange discharge to facility once family in agreement with plan.  03/22/14 [place], RN BSN CM Pt condition worsening and will transfer to stepdown. Will continue to follow for discharge planning needs.  03/21/14 [place], RN BSN CM

## 2014-03-21 NOTE — ED Notes (Signed)
Chest congestion for one week, cough worse today, wheezing tonight. Given neb by EMS

## 2014-03-22 ENCOUNTER — Inpatient Hospital Stay (HOSPITAL_COMMUNITY): Payer: Medicare FFS

## 2014-03-22 DIAGNOSIS — N183 Chronic kidney disease, stage 3 unspecified: Secondary | ICD-10-CM | POA: Diagnosis present

## 2014-03-22 DIAGNOSIS — D72829 Elevated white blood cell count, unspecified: Secondary | ICD-10-CM | POA: Diagnosis present

## 2014-03-22 DIAGNOSIS — J189 Pneumonia, unspecified organism: Secondary | ICD-10-CM | POA: Diagnosis present

## 2014-03-22 DIAGNOSIS — D649 Anemia, unspecified: Secondary | ICD-10-CM | POA: Diagnosis present

## 2014-03-22 LAB — CBC WITH DIFFERENTIAL/PLATELET
BASOS ABS: 0.1 10*3/uL (ref 0.0–0.1)
BASOS PCT: 0 % (ref 0–1)
Eosinophils Absolute: 0 10*3/uL (ref 0.0–0.7)
Eosinophils Relative: 0 % (ref 0–5)
HCT: 34.1 % — ABNORMAL LOW (ref 39.0–52.0)
Hemoglobin: 10.7 g/dL — ABNORMAL LOW (ref 13.0–17.0)
Lymphocytes Relative: 6 % — ABNORMAL LOW (ref 12–46)
Lymphs Abs: 0.7 10*3/uL (ref 0.7–4.0)
MCH: 24.9 pg — AB (ref 26.0–34.0)
MCHC: 31.4 g/dL (ref 30.0–36.0)
MCV: 79.5 fL (ref 78.0–100.0)
Monocytes Absolute: 0.7 10*3/uL (ref 0.1–1.0)
Monocytes Relative: 6 % (ref 3–12)
NEUTROS ABS: 10.6 10*3/uL — AB (ref 1.7–7.7)
NEUTROS PCT: 88 % — AB (ref 43–77)
PLATELETS: 313 10*3/uL (ref 150–400)
RBC: 4.29 MIL/uL (ref 4.22–5.81)
RDW: 17.1 % — ABNORMAL HIGH (ref 11.5–15.5)
WBC: 12.1 10*3/uL — ABNORMAL HIGH (ref 4.0–10.5)

## 2014-03-22 LAB — BLOOD GAS, ARTERIAL
Acid-Base Excess: 0.7 mmol/L (ref 0.0–2.0)
Bicarbonate: 25.3 mEq/L — ABNORMAL HIGH (ref 20.0–24.0)
DRAWN BY: 27407
O2 CONTENT: 3 L/min
O2 Saturation: 97.1 %
PCO2 ART: 43.9 mmHg (ref 35.0–45.0)
Patient temperature: 37
TCO2: 23.3 mmol/L (ref 0–100)
pH, Arterial: 7.378 (ref 7.350–7.450)
pO2, Arterial: 98.6 mmHg (ref 80.0–100.0)

## 2014-03-22 LAB — LEGIONELLA ANTIGEN, URINE

## 2014-03-22 LAB — COMPREHENSIVE METABOLIC PANEL
ALT: 43 U/L (ref 0–53)
ANION GAP: 15 (ref 5–15)
AST: 36 U/L (ref 0–37)
Albumin: 2.7 g/dL — ABNORMAL LOW (ref 3.5–5.2)
Alkaline Phosphatase: 218 U/L — ABNORMAL HIGH (ref 39–117)
BUN: 49 mg/dL — AB (ref 6–23)
CO2: 26 meq/L (ref 19–32)
Calcium: 8.7 mg/dL (ref 8.4–10.5)
Chloride: 102 mEq/L (ref 96–112)
Creatinine, Ser: 2.48 mg/dL — ABNORMAL HIGH (ref 0.50–1.35)
GFR, EST AFRICAN AMERICAN: 24 mL/min — AB (ref >90)
GFR, EST NON AFRICAN AMERICAN: 20 mL/min — AB (ref >90)
GLUCOSE: 105 mg/dL — AB (ref 70–99)
Potassium: 4.4 mEq/L (ref 3.7–5.3)
SODIUM: 143 meq/L (ref 137–147)
TOTAL PROTEIN: 6.9 g/dL (ref 6.0–8.3)
Total Bilirubin: 0.2 mg/dL — ABNORMAL LOW (ref 0.3–1.2)

## 2014-03-22 LAB — MRSA PCR SCREENING: MRSA BY PCR: NEGATIVE

## 2014-03-22 MED ORDER — METHYLPREDNISOLONE SODIUM SUCC 125 MG IJ SOLR
60.0000 mg | Freq: Two times a day (BID) | INTRAMUSCULAR | Status: DC
  Administered 2014-03-22 – 2014-03-23 (×2): 60 mg via INTRAVENOUS
  Filled 2014-03-22 (×2): qty 2

## 2014-03-22 MED ORDER — METHYLPREDNISOLONE SODIUM SUCC 125 MG IJ SOLR
60.0000 mg | Freq: Four times a day (QID) | INTRAMUSCULAR | Status: DC
  Administered 2014-03-22: 60 mg via INTRAVENOUS
  Filled 2014-03-22: qty 2

## 2014-03-22 MED ORDER — FUROSEMIDE 10 MG/ML IJ SOLN
40.0000 mg | Freq: Every day | INTRAMUSCULAR | Status: DC
  Administered 2014-03-22 – 2014-03-23 (×2): 40 mg via INTRAVENOUS
  Filled 2014-03-22 (×2): qty 4

## 2014-03-22 NOTE — Progress Notes (Signed)
[facility] PROGRESS NOTE  Corey Cain JEH:[id number] DOB: [birth year] DOA: 03/21/2014 PCP: No primary care provider on file.  Assessment/Plan: 1. Acute respiratory failure with hypoxia secondary to questionable pneumonia vs ? Chf.  He was admitted to telemetry and starte don IV antibiotics empirically for pneumonia. Cultures are pending. He is being evaluated for CHF with echocardiogram. Cardiac enzymes are negative and EKG does not show any ischemic changes. Echocardiogram shows grade 1 diastolic dysfunction and elevated pulmonary artery pressures.  Continue nasal canula oxygen to keep sats>90%. Urine strep negative. ABg looks okay. Plan to give lasix 40 mg daily and repeat CXR today.  Also started patient on IV solumedrol and duonebs.    Renal failure/ ? Acute vs CKD: UA, and US renal ordered. They are negative for infection and obstruction respectively.  Renal insufficiency probably CKD.   Hypertension controlled.   Leukocytosis: possibly from the pneumonia. Continue to monitor.   Mild anemia: Normocytic. Probably anemia of chronic disease.   Code Status: full code Family Communication: family at bedside Disposition Plan: transfer patient to stepdown.  Consultants:  none  Procedures:  echocardiogram  Antibiotics:  Rocephin 11/12  zithromax 11/12  HPI/Subjective: He is slightly restless, and audible wheezing heard. But he is alert and oriented.   Objective: Filed Vitals:   03/22/14 1152  BP:   Pulse:   Temp: 98.4 F (36.9 C)  Resp:     Intake/Output Summary (Last 24 hours) at 03/22/14 1406 Last data filed at 03/22/14 0900  Gross per 24 hour  Intake    120 ml  Output      0 ml  Net    120 ml   Filed Weights   03/21/14 0500 03/22/14 0502  Weight: 79.924 kg (176 lb 3.2 oz) 81.557 kg (179 lb 12.8 oz)    Exam:   General:  Alert afebrile comfortablesitting in the chair on 1 lit [place] oxygen.  Cardiovascular: s1s2  Respiratory: chest clear to  auscultation , no wheezing heard.   Abdomen: soft non tender non distended bowel sounds heard  Musculoskeletal: pedal edema +1  Data Reviewed: Basic Metabolic Panel:  Recent Labs Lab 03/21/14 0228 03/21/14 0500 03/22/14 0521  NA 141 143 143  K 4.6 4.4 4.4  CL 100 103 102  CO2 27 26 26   GLUCOSE 132* 144* 105*  BUN 46* 46* 49*  CREATININE 2.56* 2.56* 2.48*  CALCIUM 8.9 8.8 8.7   Liver Function Tests:  Recent Labs Lab 03/21/14 0500 03/22/14 0521  AST 45* 36  ALT 53 43  ALKPHOS 237* 218*  BILITOT 0.2* 0.2*  PROT 7.2 6.9  ALBUMIN 2.9* 2.7*   No results for input(s): LIPASE, AMYLASE in the last 168 hours. No results for input(s): AMMONIA in the last 168 hours. CBC:  Recent Labs Lab 03/21/14 0228 03/21/14 0500 03/22/14 0521  WBC 14.1* 13.6* 12.1*  NEUTROABS 12.2* 12.3* 10.6*  HGB 11.4* 11.0* 10.7*  HCT 36.5* 35.0* 34.1*  MCV 80.2 80.3 79.5  PLT 310 358 313   Cardiac Enzymes:  Recent Labs Lab 03/21/14 0228 03/21/14 0500 03/21/14 0936 03/21/14 1548  TROPONINI <0.30 <0.30 <0.30 <0.30   BNP (last 3 results)  Recent Labs  03/21/14 0228  PROBNP 2695.0*   CBG: No results for input(s): GLUCAP in the last 168 hours.  Recent Results (from the past 240 hour(s))  Culture, blood (routine x 2)     Status: None (Preliminary result)   Collection Time: 03/21/14  3:30 AM  Result Value Ref Range  Status   Specimen Description BLOOD RIGHT ANTECUBITAL  Final   Special Requests BOTTLES DRAWN AEROBIC AND ANAEROBIC 10CC EACH  Final   Culture NO GROWTH <24 HRS  Final   Report Status PENDING  Incomplete  Culture, blood (routine x 2)     Status: None (Preliminary result)   Collection Time: 03/21/14  3:35 AM  Result Value Ref Range Status   Specimen Description BLOOD LEFT ANTECUBITAL  Final   Special Requests   Final    BOTTLES DRAWN AEROBIC AND ANAEROBIC AEB 4CC ANA 2CC   Culture NO GROWTH <24 HRS  Final   Report Status PENDING  Incomplete     Studies: Dg Chest  2 View  03/21/2014   CLINICAL DATA:  Cough for 1 week and congestion for 1 day. Shortness of breath and wheezing tonight.  EXAM: CHEST  2 VIEW  COMPARISON:  None.  FINDINGS: There is elevation of the right hemidiaphragm relative to the left. The patient has a small left pleural effusion with basilar atelectasis. The right lung is clear. No pneumothorax is identified. Heart size is upper normal. There is a fracture of the posterior arc of the left seventh rib which is age indeterminate.  IMPRESSION: Small left pleural effusion and basilar airspace disease, likely atelectasis.  Age indeterminate left seventh rib fracture.   Electronically Signed   By: Inge Rise M.D.   On: 03/21/2014 03:04   US Renal  03/22/2014   CLINICAL DATA:  Renal failure.  EXAM: RENAL/URINARY TRACT ULTRASOUND COMPLETE  COMPARISON:  None.  FINDINGS: Right Kidney:  The right kidney is suboptimally evaluated secondary to poor sonographic window and diffuse cortical echogenicity. The right kidney appears normal in size measuring 13.3 cm in length. Evaluation for hydronephrosis is degraded secondary to poor sonographic window.  Left Kidney:  The left kidney is normal in size measuring 13.4 cm in length, however there is mild diffuse thinning of the left-sided renal cortical parenchyma with associated increased cortical echogenicity. Note is made of an exophytic slightly lobulated approximately 4.8 x 4.5 x 4.5 cm left-sided renal cyst. No evidence of left-sided urinary obstruction.  Bladder:  Appears normal for degree of bladder distention.  IMPRESSION: 1. Diffuse increased echogenicity of the bilateral renal parenchyma suggestive of medical renal disease. 2. No evidence of left-sided urinary obstruction. 3. Nondiagnostic evaluation for right-sided urinary obstruction secondary to poor sonographic window. Further evaluation could be performed with noncontrast abdominal CT as clinically indicated. 4. Incidentally noted approximately 4.8 cm  partially exophytic left-sided renal cyst.   Electronically Signed   By: Sandi Mariscal M.D.   On: 03/22/2014 09:46    Scheduled Meds: . antiseptic oral rinse  7 mL Mouth Rinse BID  . aspirin  81 mg Oral Daily  . atorvastatin  20 mg Oral q1800  . azithromycin  500 mg Intravenous Q24H  . cefTRIAXone (ROCEPHIN)  IV  1 g Intravenous Q0600  . furosemide  40 mg Intravenous Daily  . guaiFENesin  600 mg Oral BID  . heparin  5,000 Units Subcutaneous 3 times per day  . losartan  100 mg Oral Daily   And  . hydrochlorothiazide  12.5 mg Oral Daily  . ipratropium-albuterol  3 mL Nebulization Q6H  . loratadine  10 mg Oral Daily  . methylPREDNISolone (SOLU-MEDROL) injection  60 mg Intravenous Q12H  . pantoprazole  40 mg Oral Daily  . sertraline  50 mg Oral Daily  . sodium chloride  3 mL Intravenous Q12H  . sodium  chloride  3 mL Intravenous Q12H   Continuous Infusions:   Active Problems:   Acute respiratory failure with hypoxia   AKI (acute kidney injury)    Time spent: 30 min    Amedio Bowlby  [facility] Pager [phone] If 7PM-7AM, please contact night-coverage at [email or web address], password [facility] 03/22/2014, 2:06 PM  LOS: 1 day

## 2014-03-22 NOTE — Progress Notes (Signed)
Pt in bed. Head of bed up, O2 2L via [place]. Pt seems much more comfortable than before. Sitter at bedside. No sign of distress.

## 2014-03-22 NOTE — Progress Notes (Signed)
Pt's son states that the pt does not respond well with Ativan or Ambien and does not wish for the pt to receive drugs to reduce agitation.

## 2014-03-22 NOTE — Progress Notes (Signed)
Sitter states pt seems to be having difficulty breathing and asks for breathing treatment. Pt is laying flat in bed and head of bed is lifted. O2 2L via [place]. Pt's O2 sat is 94%. Pt states he feels better sitting up. Respiratory called. Will continue to monitor.

## 2014-03-22 NOTE — Progress Notes (Signed)
5 yoM started on IV Rocephin & Zithromax for CAP & acute kidney injury.   Rocephin & Zithromax do not require renal dose adjustment.  Continue doses as ordered.  Change to PO once appropriate.  Pharmacy to sign off.  Please re-consult if needed.   Netta Cedars, PharmD, BCPS 03/22/2014@8 :48 AM

## 2014-03-23 DIAGNOSIS — N183 Chronic kidney disease, stage 3 (moderate): Secondary | ICD-10-CM

## 2014-03-23 DIAGNOSIS — J189 Pneumonia, unspecified organism: Secondary | ICD-10-CM

## 2014-03-23 LAB — CBC WITH DIFFERENTIAL/PLATELET
Basophils Absolute: 0 10*3/uL (ref 0.0–0.1)
Basophils Relative: 0 % (ref 0–1)
Eosinophils Absolute: 0 10*3/uL (ref 0.0–0.7)
Eosinophils Relative: 0 % (ref 0–5)
HCT: 34.3 % — ABNORMAL LOW (ref 39.0–52.0)
Hemoglobin: 10.6 g/dL — ABNORMAL LOW (ref 13.0–17.0)
LYMPHS PCT: 3 % — AB (ref 12–46)
Lymphs Abs: 0.3 10*3/uL — ABNORMAL LOW (ref 0.7–4.0)
MCH: 24.6 pg — ABNORMAL LOW (ref 26.0–34.0)
MCHC: 30.9 g/dL (ref 30.0–36.0)
MCV: 79.6 fL (ref 78.0–100.0)
MONO ABS: 0.1 10*3/uL (ref 0.1–1.0)
Monocytes Relative: 1 % — ABNORMAL LOW (ref 3–12)
Neutro Abs: 11.4 10*3/uL — ABNORMAL HIGH (ref 1.7–7.7)
Neutrophils Relative %: 96 % — ABNORMAL HIGH (ref 43–77)
Platelets: 335 10*3/uL (ref 150–400)
RBC: 4.31 MIL/uL (ref 4.22–5.81)
RDW: 16.8 % — AB (ref 11.5–15.5)
WBC: 11.9 10*3/uL — AB (ref 4.0–10.5)

## 2014-03-23 LAB — BASIC METABOLIC PANEL
ANION GAP: 16 — AB (ref 5–15)
BUN: 62 mg/dL — ABNORMAL HIGH (ref 6–23)
CHLORIDE: 100 meq/L (ref 96–112)
CO2: 26 meq/L (ref 19–32)
CREATININE: 2.63 mg/dL — AB (ref 0.50–1.35)
Calcium: 8.5 mg/dL (ref 8.4–10.5)
GFR calc Af Amer: 22 mL/min — ABNORMAL LOW (ref >90)
GFR calc non Af Amer: 19 mL/min — ABNORMAL LOW (ref >90)
Glucose, Bld: 144 mg/dL — ABNORMAL HIGH (ref 70–99)
Potassium: 4.3 mEq/L (ref 3.7–5.3)
SODIUM: 142 meq/L (ref 137–147)

## 2014-03-23 MED ORDER — AZITHROMYCIN 250 MG PO TABS: 500.0000 mg | ORAL_TABLET | Freq: Every day | ORAL | Status: DC

## 2014-03-23 MED ORDER — PIPERACILLIN-TAZOBACTAM IN DEX 2-0.25 GM/50ML IV SOLN
2.2500 g | Freq: Three times a day (TID) | INTRAVENOUS | Status: DC
  Administered 2014-03-23 – 2014-03-29 (×18): 2.25 g via INTRAVENOUS
  Filled 2014-03-23 (×4): qty 50
  Filled 2014-03-23: qty 0
  Filled 2014-03-23 (×15): qty 50
  Filled 2014-03-23: qty 45
  Filled 2014-03-23 (×8): qty 50

## 2014-03-23 MED ORDER — VANCOMYCIN HCL IN DEXTROSE 1-5 GM/200ML-% IV SOLN
1000.0000 mg | INTRAVENOUS | Status: DC
  Administered 2014-03-23: 1000 mg via INTRAVENOUS
  Filled 2014-03-23: qty 200

## 2014-03-23 MED ORDER — PIPERACILLIN-TAZOBACTAM IN DEX 2-0.25 GM/50ML IV SOLN
INTRAVENOUS | Status: AC
  Filled 2014-03-23: qty 100

## 2014-03-23 MED ORDER — PREDNISONE 20 MG PO TABS
40.0000 mg | ORAL_TABLET | Freq: Every day | ORAL | Status: AC
  Administered 2014-03-24 – 2014-03-26 (×3): 40 mg via ORAL
  Filled 2014-03-23 (×3): qty 2

## 2014-03-23 NOTE — Progress Notes (Signed)
PHARMACIST - PHYSICIAN COMMUNICATION DR:  Corey Cain CONCERNING: Antibiotic IV to Oral Route Change Policy  RECOMMENDATION: This patient is receiving Zithromax by the intravenous route.  Based on criteria approved by the Pharmacy and Therapeutics Committee, the antibiotic(s) is/are being converted to the equivalent oral dose form(s).   DESCRIPTION: These criteria include:  Patient being treated for a respiratory tract infection, urinary tract infection, cellulitis or clostridium difficile associated diarrhea if on metronidazole  The patient is not neutropenic and does not exhibit a GI malabsorption state  The patient is eating (either orally or via tube) and/or has been taking other orally administered medications for a least 24 hours  The patient is improving clinically and has a Tmax < 100.5  If you have questions about this conversion, please contact the Pharmacy Department  [x]   [phone] )  Corey Cain []   [phone] )  Corey Cain  []   [phone] )  [facility] []   [phone] )  [place], PharmD, BCPS 03/23/2014@11 :24 AM

## 2014-03-23 NOTE — Progress Notes (Signed)
[facility] PROGRESS NOTE  Corey Cain JGO:[id number] DOB: 10-07-1914 DOA: 03/21/2014 PCP: No primary care provider on file.  Assessment/Plan: 1. Acute respiratory failure with hypoxia secondary to  pneumonia and diastolic Chf.  He was admitted to telemetry and starte don IV antibiotics empirically for pneumonia. Cultures are pending. He is being evaluated for CHF with echocardiogram. Cardiac enzymes are negative and EKG does not show any ischemic changes. Echocardiogram shows grade 1 diastolic dysfunction and elevated pulmonary artery pressures.  Continue nasal canula oxygen to keep sats>90%. Urine strep negative. ABg looks okay. One dose of IV lasix given and repeat CXR shows icnresing edema and congestive heart failure with increasing density in the right upper lobe concerning for aspiration pneumonia. Will change antibiotics to iv zosyn and vancomycin and get swallow evaluation.  He is not wheezing anymore, transition to oral steroids.    Renal failure/ ? Acute vs CKD: UA, and US renal ordered. They are negative for infection and obstruction respectively.  Renal insufficiency probably CKD.  Slight worsening of the renal function after the lasix. Will hold the lasix tomorrow and repeat BMP tonight.   Hypertension controlled.   Leukocytosis: possibly from the pneumonia. Improving. Continue to monitor.   Mild anemia: Normocytic. Probably anemia of chronic disease.   Code Status: full code Family Communication: family at bedside Disposition Plan: remain in stepdown.   Consultants: Physical therapy Speech eval.  Procedures:  echocardiogram  Antibiotics:  Rocephin 11/12  zithromax 11/12  HPI/Subjective:  he is alert and oriented. Able to answer questions.   Objective: Filed Vitals:   03/23/14 1152  BP:   Pulse:   Temp: 97.4 F (36.3 C)  Resp:     Intake/Output Summary (Last 24 hours) at 03/23/14 1414 Last data filed at 03/23/14 1338  Gross per 24 hour   Intake    650 ml  Output   1120 ml  Net   -470 ml   Filed Weights   03/21/14 0500 03/22/14 0502 03/23/14 0500  Weight: 79.924 kg (176 lb 3.2 oz) 81.557 kg (179 lb 12.8 oz) 80.8 kg (178 lb 2.1 oz)    Exam:   General:  Alert afebrile comfortable laying the bed on 3 lit [place] oxygen.  Cardiovascular: s1s2  Respiratory: chest clear to auscultation , no wheezing heard.   Abdomen: soft non tender non distended bowel sounds heard  Musculoskeletal: pedal edema +1  Data Reviewed: Basic Metabolic Panel:  Recent Labs Lab 03/21/14 0228 03/21/14 0500 03/22/14 0521 03/23/14 0500  NA 141 143 143 142  K 4.6 4.4 4.4 4.3  CL 100 103 102 100  CO2 27 26 26 26   GLUCOSE 132* 144* 105* 144*  BUN 46* 46* 49* 62*  CREATININE 2.56* 2.56* 2.48* 2.63*  CALCIUM 8.9 8.8 8.7 8.5   Liver Function Tests:  Recent Labs Lab 03/21/14 0500 03/22/14 0521  AST 45* 36  ALT 53 43  ALKPHOS 237* 218*  BILITOT 0.2* 0.2*  PROT 7.2 6.9  ALBUMIN 2.9* 2.7*   No results for input(s): LIPASE, AMYLASE in the last 168 hours. No results for input(s): AMMONIA in the last 168 hours. CBC:  Recent Labs Lab 03/21/14 0228 03/21/14 0500 03/22/14 0521 03/23/14 0500  WBC 14.1* 13.6* 12.1* 11.9*  NEUTROABS 12.2* 12.3* 10.6* 11.4*  HGB 11.4* 11.0* 10.7* 10.6*  HCT 36.5* 35.0* 34.1* 34.3*  MCV 80.2 80.3 79.5 79.6  PLT 310 358 313 335   Cardiac Enzymes:  Recent Labs Lab 03/21/14 0228 03/21/14 0500 03/21/14 0936 03/21/14  [place] <0.30 <0.30 <0.30 <0.30   BNP (last 3 results)  Recent Labs  03/21/14 0228  PROBNP 2695.0*   CBG: No results for input(s): GLUCAP in the last 168 hours.  Recent Results (from the past 240 hour(s))  Culture, blood (routine x 2)     Status: None (Preliminary result)   Collection Time: 03/21/14  3:30 AM  Result Value Ref Range Status   Specimen Description BLOOD RIGHT ANTECUBITAL  Final   Special Requests BOTTLES DRAWN AEROBIC AND ANAEROBIC 10CC EACH  Final    Culture NO GROWTH 2 DAYS  Final   Report Status PENDING  Incomplete  Culture, blood (routine x 2)     Status: None (Preliminary result)   Collection Time: 03/21/14  3:35 AM  Result Value Ref Range Status   Specimen Description BLOOD LEFT ANTECUBITAL  Final   Special Requests   Final    BOTTLES DRAWN AEROBIC AND ANAEROBIC AEB=4CC ANA=2CC   Culture NO GROWTH 2 DAYS  Final   Report Status PENDING  Incomplete  MRSA PCR Screening     Status: None   Collection Time: 03/22/14 11:33 AM  Result Value Ref Range Status   MRSA by PCR NEGATIVE NEGATIVE Final    Comment:        The GeneXpert MRSA Assay (FDA approved for NASAL specimens only), is one component of a comprehensive MRSA colonization surveillance program. It is not intended to diagnose MRSA infection nor to guide or monitor treatment for MRSA infections.      Studies: US Renal  03/22/2014   CLINICAL DATA:  Renal failure.  EXAM: RENAL/URINARY TRACT ULTRASOUND COMPLETE  COMPARISON:  None.  FINDINGS: Right Kidney:  The right kidney is suboptimally evaluated secondary to poor sonographic window and diffuse cortical echogenicity. The right kidney appears normal in size measuring 13.3 cm in length. Evaluation for hydronephrosis is degraded secondary to poor sonographic window.  Left Kidney:  The left kidney is normal in size measuring 13.4 cm in length, however there is mild diffuse thinning of the left-sided renal cortical parenchyma with associated increased cortical echogenicity. Note is made of an exophytic slightly lobulated approximately 4.8 x 4.5 x 4.5 cm left-sided renal cyst. No evidence of left-sided urinary obstruction.  Bladder:  Appears normal for degree of bladder distention.  IMPRESSION: 1. Diffuse increased echogenicity of the bilateral renal parenchyma suggestive of medical renal disease. 2. No evidence of left-sided urinary obstruction. 3. Nondiagnostic evaluation for right-sided urinary obstruction secondary to poor  sonographic window. Further evaluation could be performed with noncontrast abdominal CT as clinically indicated. 4. Incidentally noted approximately 4.8 cm partially exophytic left-sided renal cyst.   Electronically Signed   By: Sandi Mariscal M.D.   On: 03/22/2014 09:46   Dg Chest Port 1 View  03/22/2014   CLINICAL DATA:  Pneumonia.  EXAM: PORTABLE CHEST - 1 VIEW  COMPARISON:  Two-view chest 03/21/2014.  FINDINGS: The heart is mildly enlarged. Interstitial edema has increased. Right upper lobe consolidation is developing. Elevation of the right hemidiaphragm is again noted. Bilateral pleural effusions are again noted. Bibasilar airspace disease likely reflects atelectasis.  The left seventh rib fracture appears old. It is unchanged. There is no pneumothorax. Atherosclerotic calcifications are noted at the aortic arch.  IMPRESSION: 1. Increasing density in the right upper lobe concerning for developing infection or aspiration. 2. Increasing edema and probable congestive heart failure. 3. Bilateral pleural effusions. 4. Bibasilar airspace disease likely reflects atelectasis.   Electronically Signed   By:  Lawrence Santiago M.D.   On: 03/22/2014 14:14    Scheduled Meds: . antiseptic oral rinse  7 mL Mouth Rinse BID  . aspirin  81 mg Oral Daily  . atorvastatin  20 mg Oral q1800  . [START ON 03/24/2014] azithromycin  500 mg Oral Daily  . cefTRIAXone (ROCEPHIN)  IV  1 g Intravenous Q0600  . furosemide  40 mg Intravenous Daily  . guaiFENesin  600 mg Oral BID  . heparin  5,000 Units Subcutaneous 3 times per day  . ipratropium-albuterol  3 mL Nebulization Q6H  . loratadine  10 mg Oral Daily  . pantoprazole  40 mg Oral Daily  . [START ON 03/24/2014] predniSONE  40 mg Oral QAC breakfast  . sertraline  50 mg Oral Daily  . sodium chloride  3 mL Intravenous Q12H  . sodium chloride  3 mL Intravenous Q12H   Continuous Infusions:   Active Problems:   Acute respiratory failure with hypoxia   AKI (acute kidney  injury)   CKD (chronic kidney disease), stage III   CAP (community acquired pneumonia)   Anemia   Leukocytosis    Time spent: 30 min    Donavon Kimrey  [facility] Pager [phone] If 7PM-7AM, please contact night-coverage at [email or web address], password [facility] 03/23/2014, 2:14 PM  LOS: 2 days

## 2014-03-23 NOTE — Progress Notes (Signed)
ANTIBIOTIC CONSULT NOTE  Pharmacy Consult for Vancomycin & Zosyn Indication: pneumonia  No Known Allergies  Patient Measurements: Height: 5\' 6"  (167.6 cm) Weight: 178 lb 2.1 oz (80.8 kg) IBW/kg (Calculated) : 63.8  Vital Signs: Temp: 97.4 F (36.3 C) (11/14 1152) Temp Source: Oral (11/14 1152) BP: 145/50 mmHg (11/14 1000) Pulse Rate: 87 (11/14 1000) Intake/Output from previous day: 11/13 0701 - 11/14 0700 In: 270 [P.O.:120; IV Piggyback:150] Out: 620 [Urine:620] Intake/Output from this shift: Total I/O In: 500 [IV Piggyback:500] Out: 500 [Urine:500]  Labs:  Recent Labs  03/21/14 0500 03/22/14 0521 03/23/14 0500  WBC 13.6* 12.1* 11.9*  HGB 11.0* 10.7* 10.6*  PLT 358 313 335  CREATININE 2.56* 2.48* 2.63*   Estimated Creatinine Clearance: 16 mL/min (by C-G formula based on Cr of 2.63). No results for input(s): VANCOTROUGH, VANCOPEAK, VANCORANDOM, GENTTROUGH, GENTPEAK, GENTRANDOM, TOBRATROUGH, TOBRAPEAK, TOBRARND, AMIKACINPEAK, AMIKACINTROU, AMIKACIN in the last 72 hours.   Microbiology: Recent Results (from the past 720 hour(s))  Culture, blood (routine x 2)     Status: None (Preliminary result)   Collection Time: 03/21/14  3:30 AM  Result Value Ref Range Status   Specimen Description BLOOD RIGHT ANTECUBITAL  Final   Special Requests BOTTLES DRAWN AEROBIC AND ANAEROBIC 10CC EACH  Final   Culture NO GROWTH 2 DAYS  Final   Report Status PENDING  Incomplete  Culture, blood (routine x 2)     Status: None (Preliminary result)   Collection Time: 03/21/14  3:35 AM  Result Value Ref Range Status   Specimen Description BLOOD LEFT ANTECUBITAL  Final   Special Requests   Final    BOTTLES DRAWN AEROBIC AND ANAEROBIC AEB=4CC ANA=2CC   Culture NO GROWTH 2 DAYS  Final   Report Status PENDING  Incomplete  MRSA PCR Screening     Status: None   Collection Time: 03/22/14 11:33 AM  Result Value Ref Range Status   MRSA by PCR NEGATIVE NEGATIVE Final    Comment:        The  GeneXpert MRSA Assay (FDA approved for NASAL specimens only), is one component of a comprehensive MRSA colonization surveillance program. It is not intended to diagnose MRSA infection nor to guide or monitor treatment for MRSA infections.     Anti-infectives    Start     Dose/Rate Route Frequency Ordered Stop   03/24/14 1000  azithromycin (ZITHROMAX) tablet 500 mg  Status:  Discontinued     500 mg Oral Daily 03/23/14 1124 03/23/14 1415   03/22/14 0800  azithromycin (ZITHROMAX) 500 mg in dextrose 5 % 250 mL IVPB  Status:  Discontinued     500 mg250 mL/hr over 60 Minutes Intravenous Every 24 hours 03/21/14 0517 03/23/14 1124   03/21/14 0415  cefTRIAXone (ROCEPHIN) 1 g in dextrose 5 % 50 mL IVPB  Status:  Discontinued     1 g100 mL/hr over 30 Minutes Intravenous Daily 03/21/14 0402 03/23/14 1415   03/21/14 0400  cefTRIAXone (ROCEPHIN) 1 g in dextrose 5 % 50 mL IVPB  Status:  Discontinued     1 g100 mL/hr over 30 Minutes Intravenous Every 24 hours 03/21/14 0356 03/21/14 0400   03/21/14 0400  azithromycin (ZITHROMAX) 500 mg in dextrose 5 % 250 mL IVPB  Status:  Discontinued     500 mg250 mL/hr over 60 Minutes Intravenous Every 24 hours 03/21/14 0356 03/21/14 0518   03/21/14 0345  cefTRIAXone (ROCEPHIN) 1 g in dextrose 5 % 50 mL IVPB  Status:  Discontinued  1 g100 mL/hr over 30 Minutes Intravenous  Once 03/21/14 0330 03/21/14 0400   03/21/14 0345  azithromycin (ZITHROMAX) 500 mg in dextrose 5 % 250 mL IVPB  Status:  Discontinued     500 mg250 mL/hr over 60 Minutes Intravenous Every 24 hours 03/21/14 0330 03/21/14 0356      Assessment: 59 yoM admitted with acute respiratory failure with hypoxia secondary to PNA vs HF on 03/21/14.  He has been on Rocephin & Zithromax since admission, however CXR today reveals worsening density in RUL concerning for aspiration PNA.  Antibiotic coverage being broadened.   He remains afebrile.  WBC mildly elevated, but trending down since admission.   Acute  on chronic renal insufficiency noted.   Vancomycin 11/14>> Zosyn 11/14>> Rocephin 11/12>>11/14 Zithromax 11/12>>11/14  Goal of Therapy:  Vancomycin trough level 15-20 mcg/ml  Plan:  Zosyn 2.25gm IV q8h Vancomycin 1000mg  IV Q48h Check Vancomycin trough at steady state Monitor renal function and cx data   Biagio Borg 03/23/2014,2:35 PM

## 2014-03-24 ENCOUNTER — Inpatient Hospital Stay (HOSPITAL_COMMUNITY): Payer: Medicare FFS

## 2014-03-24 LAB — CBC WITH DIFFERENTIAL/PLATELET
Basophils Absolute: 0 10*3/uL (ref 0.0–0.1)
Basophils Relative: 0 % (ref 0–1)
Eosinophils Absolute: 0 10*3/uL (ref 0.0–0.7)
Eosinophils Relative: 0 % (ref 0–5)
HEMATOCRIT: 31.6 % — AB (ref 39.0–52.0)
HEMOGLOBIN: 10.3 g/dL — AB (ref 13.0–17.0)
LYMPHS PCT: 4 % — AB (ref 12–46)
Lymphs Abs: 0.7 10*3/uL (ref 0.7–4.0)
MCH: 25.6 pg — ABNORMAL LOW (ref 26.0–34.0)
MCHC: 32.6 g/dL (ref 30.0–36.0)
MCV: 78.4 fL (ref 78.0–100.0)
MONOS PCT: 7 % (ref 3–12)
Monocytes Absolute: 1.2 10*3/uL — ABNORMAL HIGH (ref 0.1–1.0)
Neutro Abs: 15.3 10*3/uL — ABNORMAL HIGH (ref 1.7–7.7)
Neutrophils Relative %: 89 % — ABNORMAL HIGH (ref 43–77)
Platelets: 331 10*3/uL (ref 150–400)
RBC: 4.03 MIL/uL — ABNORMAL LOW (ref 4.22–5.81)
RDW: 16.7 % — ABNORMAL HIGH (ref 11.5–15.5)
WBC: 17.3 10*3/uL — AB (ref 4.0–10.5)

## 2014-03-24 LAB — BASIC METABOLIC PANEL
ANION GAP: 15 (ref 5–15)
BUN: 77 mg/dL — AB (ref 6–23)
CHLORIDE: 100 meq/L (ref 96–112)
CO2: 26 meq/L (ref 19–32)
CREATININE: 2.95 mg/dL — AB (ref 0.50–1.35)
Calcium: 7.7 mg/dL — ABNORMAL LOW (ref 8.4–10.5)
GFR calc Af Amer: 19 mL/min — ABNORMAL LOW (ref >90)
GFR calc non Af Amer: 16 mL/min — ABNORMAL LOW (ref >90)
Glucose, Bld: 91 mg/dL (ref 70–99)
Potassium: 3.8 mEq/L (ref 3.7–5.3)
Sodium: 141 mEq/L (ref 137–147)

## 2014-03-24 LAB — SODIUM, URINE, RANDOM: SODIUM UR: 57 meq/L

## 2014-03-24 LAB — CREATININE, URINE, RANDOM: CREATININE, URINE: 73.67 mg/dL

## 2014-03-24 MED ORDER — DOCUSATE SODIUM 100 MG PO CAPS
100.0000 mg | ORAL_CAPSULE | Freq: Two times a day (BID) | ORAL | Status: DC
  Administered 2014-03-24 – 2014-03-25 (×3): 100 mg via ORAL
  Filled 2014-03-24 (×4): qty 1

## 2014-03-24 MED ORDER — SODIUM CHLORIDE 0.9 % IV SOLN
INTRAVENOUS | Status: DC
  Administered 2014-03-24: 10:00:00 via INTRAVENOUS

## 2014-03-24 MED ORDER — FUROSEMIDE 10 MG/ML IJ SOLN
60.0000 mg | Freq: Two times a day (BID) | INTRAMUSCULAR | Status: DC
  Administered 2014-03-24 – 2014-03-27 (×7): 60 mg via INTRAVENOUS
  Filled 2014-03-24 (×8): qty 6

## 2014-03-24 NOTE — Consult Note (Signed)
Reason for Consult:acute kidney injury Referring Physician: Dr. [name] is an 80 y.o. male.  HPI: he is a patient was long-standing history of hypertension, colon cancer status post colectomy 2005 and possibly chronic renal failure for the last 1 year presently was brought by his families because of cough, difficulty in breathing. When he was evaluated patient was found to be hypoxemic and admitted to ICU. Presently patient still has intermittent cough which seems to be better according to the patient and his son. His son told me his creatinine has been a little bit high but less than 2. Patient does not have any nausea or vomiting and appetite is good. There is no previous history of kidney stone.  Past Medical History  Diagnosis Date  . Cancer   . Colon cancer approx 2005    colon resection with resolution  . AKI (acute kidney injury) 03/21/2014    History reviewed. No pertinent past surgical history.  History reviewed. No pertinent family history.  Social History:  reports that he has never smoked. He does not have any smokeless tobacco history on file. He reports that he does not drink alcohol. His drug history is not on file.  Allergies: No Known Allergies  Medications: I have reviewed the patient'Cain current medications.  Results for orders placed or performed during the hospital encounter of 03/21/14 (from the past 48 hour(Cain))  MRSA PCR Screening     Status: None   Collection Time: 03/22/14 11:33 AM  Result Value Ref Range   MRSA by PCR NEGATIVE NEGATIVE    Comment:        The GeneXpert MRSA Assay (FDA approved for NASAL specimens only), is one component of a comprehensive MRSA colonization surveillance program. It is not intended to diagnose MRSA infection nor to guide or monitor treatment for MRSA infections.   Blood gas, arterial     Status: Abnormal   Collection Time: 03/22/14 11:40 AM  Result Value Ref Range   O2 Content 3.0 L/min   Delivery systems  NASAL CANNULA    pH, Arterial 7.378 7.350 - 7.450   pCO2 arterial 43.9 35.0 - 45.0 mmHg   pO2, Arterial 98.6 80.0 - 100.0 mmHg   Bicarbonate 25.3 (H) 20.0 - 24.0 mEq/L   TCO2 23.3 0 - 100 mmol/L   Acid-Base Excess 0.7 0.0 - 2.0 mmol/L   O2 Saturation 97.1 %   Patient temperature 37.0    Collection site RIGHT RADIAL    Drawn by [phone]    Sample type ARTERIAL DRAW    Allens test (pass/fail) PASS PASS  CBC WITH DIFFERENTIAL     Status: Abnormal   Collection Time: 03/23/14  5:00 AM  Result Value Ref Range   WBC 11.9 (H) 4.0 - 10.5 K/uL   RBC 4.31 4.22 - 5.81 MIL/uL   Hemoglobin 10.6 (L) 13.0 - 17.0 g/dL   HCT 34.3 (L) 39.0 - 52.0 %   MCV 79.6 78.0 - 100.0 fL   MCH 24.6 (L) 26.0 - 34.0 pg   MCHC 30.9 30.0 - 36.0 g/dL   RDW 16.8 (H) 11.5 - 15.5 %   Platelets 335 150 - 400 K/uL   Neutrophils Relative % 96 (H) 43 - 77 %   Neutro Abs 11.4 (H) 1.7 - 7.7 K/uL   Lymphocytes Relative 3 (L) 12 - 46 %   Lymphs Abs 0.3 (L) 0.7 - 4.0 K/uL   Monocytes Relative 1 (L) 3 - 12 %   Monocytes Absolute 0.1  0.1 - 1.0 K/uL   Eosinophils Relative 0 0 - 5 %   Eosinophils Absolute 0.0 0.0 - 0.7 K/uL   Basophils Relative 0 0 - 1 %   Basophils Absolute 0.0 0.0 - 0.1 K/uL  Basic metabolic panel     Status: Abnormal   Collection Time: 03/23/14  5:00 AM  Result Value Ref Range   Sodium 142 137 - 147 mEq/L   Potassium 4.3 3.7 - 5.3 mEq/L   Chloride 100 96 - 112 mEq/L   CO2 26 19 - 32 mEq/L   Glucose, Bld 144 (H) 70 - 99 mg/dL   BUN 62 (H) 6 - 23 mg/dL   Creatinine, Ser 2.63 (H) 0.50 - 1.35 mg/dL   Calcium 8.5 8.4 - 10.5 mg/dL   GFR calc non Af Amer 19 (L) >90 mL/min   GFR calc Af Amer 22 (L) >90 mL/min    Comment: (NOTE) The eGFR has been calculated using the CKD EPI equation. This calculation has not been validated in all clinical situations. eGFR'Cain persistently <90 mL/min signify possible Chronic Kidney Disease.    Anion gap 16 (H) 5 - 15  CBC WITH DIFFERENTIAL     Status: Abnormal   Collection  Time: 03/24/14  5:17 AM  Result Value Ref Range   WBC 17.3 (H) 4.0 - 10.5 K/uL   RBC 4.03 (L) 4.22 - 5.81 MIL/uL   Hemoglobin 10.3 (L) 13.0 - 17.0 g/dL   HCT 31.6 (L) 39.0 - 52.0 %   MCV 78.4 78.0 - 100.0 fL   MCH 25.6 (L) 26.0 - 34.0 pg   MCHC 32.6 30.0 - 36.0 g/dL   RDW 16.7 (H) 11.5 - 15.5 %   Platelets 331 150 - 400 K/uL   Neutrophils Relative % 89 (H) 43 - 77 %   Neutro Abs 15.3 (H) 1.7 - 7.7 K/uL   Lymphocytes Relative 4 (L) 12 - 46 %   Lymphs Abs 0.7 0.7 - 4.0 K/uL   Monocytes Relative 7 3 - 12 %   Monocytes Absolute 1.2 (H) 0.1 - 1.0 K/uL   Eosinophils Relative 0 0 - 5 %   Eosinophils Absolute 0.0 0.0 - 0.7 K/uL   Basophils Relative 0 0 - 1 %   Basophils Absolute 0.0 0.0 - 0.1 K/uL  Basic metabolic panel     Status: Abnormal   Collection Time: 03/24/14  5:17 AM  Result Value Ref Range   Sodium 141 137 - 147 mEq/L   Potassium 3.8 3.7 - 5.3 mEq/L   Chloride 100 96 - 112 mEq/L   CO2 26 19 - 32 mEq/L   Glucose, Bld 91 70 - 99 mg/dL   BUN 77 (H) 6 - 23 mg/dL   Creatinine, Ser 2.95 (H) 0.50 - 1.35 mg/dL   Calcium 7.7 (L) 8.4 - 10.5 mg/dL   GFR calc non Af Amer 16 (L) >90 mL/min   GFR calc Af Amer 19 (L) >90 mL/min    Comment: (NOTE) The eGFR has been calculated using the CKD EPI equation. This calculation has not been validated in all clinical situations. eGFR'Cain persistently <90 mL/min signify possible Chronic Kidney Disease.    Anion gap 15 5 - 15    US Renal  03/22/2014   CLINICAL DATA:  Renal failure.  EXAM: RENAL/URINARY TRACT ULTRASOUND COMPLETE  COMPARISON:  None.  FINDINGS: Right Kidney:  The right kidney is suboptimally evaluated secondary to poor sonographic window and diffuse cortical echogenicity. The right kidney appears normal in  size measuring 13.3 cm in length. Evaluation for hydronephrosis is degraded secondary to poor sonographic window.  Left Kidney:  The left kidney is normal in size measuring 13.4 cm in length, however there is mild diffuse thinning  of the left-sided renal cortical parenchyma with associated increased cortical echogenicity. Note is made of an exophytic slightly lobulated approximately 4.8 x 4.5 x 4.5 cm left-sided renal cyst. No evidence of left-sided urinary obstruction.  Bladder:  Appears normal for degree of bladder distention.  IMPRESSION: 1. Diffuse increased echogenicity of the bilateral renal parenchyma suggestive of medical renal disease. 2. No evidence of left-sided urinary obstruction. 3. Nondiagnostic evaluation for right-sided urinary obstruction secondary to poor sonographic window. Further evaluation could be performed with noncontrast abdominal CT as clinically indicated. 4. Incidentally noted approximately 4.8 cm partially exophytic left-sided renal cyst.   Electronically Signed   By: Sandi Mariscal M.D.   On: 03/22/2014 09:46   Dg Chest Port 1 View  03/22/2014   CLINICAL DATA:  Pneumonia.  EXAM: PORTABLE CHEST - 1 VIEW  COMPARISON:  Two-view chest 03/21/2014.  FINDINGS: The heart is mildly enlarged. Interstitial edema has increased. Right upper lobe consolidation is developing. Elevation of the right hemidiaphragm is again noted. Bilateral pleural effusions are again noted. Bibasilar airspace disease likely reflects atelectasis.  The left seventh rib fracture appears old. It is unchanged. There is no pneumothorax. Atherosclerotic calcifications are noted at the aortic arch.  IMPRESSION: 1. Increasing density in the right upper lobe concerning for developing infection or aspiration. 2. Increasing edema and probable congestive heart failure. 3. Bilateral pleural effusions. 4. Bibasilar airspace disease likely reflects atelectasis.   Electronically Signed   By: Lawrence Santiago M.D.   On: 03/22/2014 14:14    Review of Systems  HENT: Positive for congestion.   Respiratory: Positive for cough, sputum production and wheezing. Negative for shortness of breath.   Cardiovascular: Positive for leg swelling. Negative for orthopnea.   Gastrointestinal: Negative for nausea and vomiting.  Neurological: Positive for weakness.   Blood pressure 137/65, pulse 84, temperature 96.9 F (36.1 C), temperature source Axillary, resp. rate 31, height $RemoveBe'5\' 6"'YgyBSinZZ$  (1.676 m), weight 81.3 kg (179 lb 3.7 oz), SpO2 97 %. Physical Exam  Constitutional: No distress.  Eyes: No scleral icterus.  Neck: No JVD present.  Cardiovascular: Normal rate and regular rhythm.   No murmur heard. Respiratory: No respiratory distress. He has wheezes. He has no rales.  GI: He exhibits no distension. There is no tenderness. There is no rebound.  Musculoskeletal: He exhibits edema.  Neurological: He is alert.    Assessment/Plan: Problem #1 acute kidney injury possibly prerenal / ATN/vancomycin related kidney injury. His non-oliguric however his BUN and creatinine is increasing. According to his son patient used to have significant edema and has been taking diuretics on and off. Presently he looks much better. Problem #2 chronic renal failure: Possibly stage III. His creatinine was high but always below 2. His ultrasound showed bilaterally normal size kidney however with increased echogenicity. The etiology could be hypertension/age-related kidney function loss. Problem #3 difficulty breathing/cough possibly secondary to community  acquired pneumonia/bilateral pleural effusion/CHF.patient to oral antibiotics and seems to be getting better Problem #4 anemia Problem #5 hypertension: His blood pressure is reasonably controlled Problem #7 history of colonic cancer status post colectomy by history Plan: We'll start patient on normal saline at 75 mL per hour We'll start on Lasix 60 mg IV twice a day to improve his urine output We'll check his  basic metabolic panel in the morning.  If his renal function continued to get worse possibly we may need to stop his vancomycin. Corey Cain 03/24/2014, 9:02 AM

## 2014-03-24 NOTE — Progress Notes (Signed)
[facility] PROGRESS NOTE  Corey Cain BTD:[id number] DOB: October 27, 1914 DOA: 03/21/2014 PCP: No primary care provider on file.  Assessment/Plan: 1. Acute respiratory failure with hypoxia secondary to  pneumonia and diastolic Chf.  He was admitted to telemetry and starte don IV antibiotics empirically for pneumonia. Cultures are pending. Echocardiogram done and on strict intake and output. Cardiac enzymes are negative and EKG does not show any ischemic changes. Echocardiogram shows grade 1 diastolic dysfunction and elevated pulmonary artery pressures.  Continue nasal canula oxygen to keep sats>90%. Urine strep negative. ABg looks okay. Repeat CXR shows increasing edema and congestive heart failure with increasing density in the right upper lobe concerning for aspiration pneumonia. He is on IV lasix for diuresis and antibiotics changed to IV zosyn. Wean his off the oxygen,.  Watching renal parameters on IV lasix.    Renal failure/ ? Acute vs CKD: UA, and US renal ordered. They are negative for infection and obstruction respectively.  Renal insufficiency probably CKD stage 3..  Slight worsening of the renal function after the lasix. BMP shows worsening renal parameters. Renal consulted and recommendations given.   Hypertension   Better controlled.   Leukocytosis:  possibly from the pneumonia and steroids.Continue to monitor.   Mild anemia: Normocytic. Probably anemia of chronic disease.   Constipation: Colace added to the medications.   Code Status: full code Family Communication: family at bedside Disposition Plan: remain in stepdown.   Consultants: Physical therapy Speech eval.  Procedures:  echocardiogram  Antibiotics:  Rocephin 11/12  zithromax 11/12  HPI/Subjective:  he is alert and oriented. Able to answer questions. Wheezing has improved. Good apettite.   Objective: Filed Vitals:   03/24/14 1000  BP: 140/56  Pulse: 81  Temp:   Resp: 23     Intake/Output Summary (Last 24 hours) at 03/24/14 1046 Last data filed at 03/24/14 1023  Gross per 24 hour  Intake    350 ml  Output   1075 ml  Net   -725 ml   Filed Weights   03/22/14 0502 03/23/14 0500 03/24/14 0500  Weight: 81.557 kg (179 lb 12.8 oz) 80.8 kg (178 lb 2.1 oz) 81.3 kg (179 lb 3.7 oz)    Exam:   General:  Alert afebrile comfortable laying the bed on 2 lit [place] oxygen.  Cardiovascular: s1s2  Respiratory:scattered rales heard.   Abdomen: soft non tender non distended bowel sounds heard  Musculoskeletal: pedal edema +1  Data Reviewed: Basic Metabolic Panel:  Recent Labs Lab 03/21/14 0228 03/21/14 0500 03/22/14 0521 03/23/14 0500 03/24/14 0517  NA 141 143 143 142 141  K 4.6 4.4 4.4 4.3 3.8  CL 100 103 102 100 100  CO2 27 26 26 26 26   GLUCOSE 132* 144* 105* 144* 91  BUN 46* 46* 49* 62* 77*  CREATININE 2.56* 2.56* 2.48* 2.63* 2.95*  CALCIUM 8.9 8.8 8.7 8.5 7.7*   Liver Function Tests:  Recent Labs Lab 03/21/14 0500 03/22/14 0521  AST 45* 36  ALT 53 43  ALKPHOS 237* 218*  BILITOT 0.2* 0.2*  PROT 7.2 6.9  ALBUMIN 2.9* 2.7*   No results for input(s): LIPASE, AMYLASE in the last 168 hours. No results for input(s): AMMONIA in the last 168 hours. CBC:  Recent Labs Lab 03/21/14 0228 03/21/14 0500 03/22/14 0521 03/23/14 0500 03/24/14 0517  WBC 14.1* 13.6* 12.1* 11.9* 17.3*  NEUTROABS 12.2* 12.3* 10.6* 11.4* 15.3*  HGB 11.4* 11.0* 10.7* 10.6* 10.3*  HCT 36.5* 35.0* 34.1* 34.3* 31.6*  MCV 80.2 80.3  79.5 79.6 78.4  PLT 310 358 313 335 331   Cardiac Enzymes:  Recent Labs Lab 03/21/14 0228 03/21/14 0500 03/21/14 0936 03/21/14 1548  TROPONINI <0.30 <0.30 <0.30 <0.30   BNP (last 3 results)  Recent Labs  03/21/14 0228  PROBNP 2695.0*   CBG: No results for input(s): GLUCAP in the last 168 hours.  Recent Results (from the past 240 hour(s))  Culture, blood (routine x 2)     Status: None (Preliminary result)   Collection Time:  03/21/14  3:30 AM  Result Value Ref Range Status   Specimen Description BLOOD RIGHT ANTECUBITAL  Final   Special Requests BOTTLES DRAWN AEROBIC AND ANAEROBIC 10CC EACH  Final   Culture NO GROWTH 3 DAYS  Final   Report Status PENDING  Incomplete  Culture, blood (routine x 2)     Status: None (Preliminary result)   Collection Time: 03/21/14  3:35 AM  Result Value Ref Range Status   Specimen Description BLOOD LEFT ANTECUBITAL  Final   Special Requests   Final    BOTTLES DRAWN AEROBIC AND ANAEROBIC AEB=4CC ANA=2CC   Culture NO GROWTH 3 DAYS  Final   Report Status PENDING  Incomplete  MRSA PCR Screening     Status: None   Collection Time: 03/22/14 11:33 AM  Result Value Ref Range Status   MRSA by PCR NEGATIVE NEGATIVE Final    Comment:        The GeneXpert MRSA Assay (FDA approved for NASAL specimens only), is one component of a comprehensive MRSA colonization surveillance program. It is not intended to diagnose MRSA infection nor to guide or monitor treatment for MRSA infections.      Studies: Dg Chest Port 1 View  03/22/2014   CLINICAL DATA:  Pneumonia.  EXAM: PORTABLE CHEST - 1 VIEW  COMPARISON:  Two-view chest 03/21/2014.  FINDINGS: The heart is mildly enlarged. Interstitial edema has increased. Right upper lobe consolidation is developing. Elevation of the right hemidiaphragm is again noted. Bilateral pleural effusions are again noted. Bibasilar airspace disease likely reflects atelectasis.  The left seventh rib fracture appears old. It is unchanged. There is no pneumothorax. Atherosclerotic calcifications are noted at the aortic arch.  IMPRESSION: 1. Increasing density in the right upper lobe concerning for developing infection or aspiration. 2. Increasing edema and probable congestive heart failure. 3. Bilateral pleural effusions. 4. Bibasilar airspace disease likely reflects atelectasis.   Electronically Signed   By: Lawrence Santiago M.D.   On: 03/22/2014 14:14    Scheduled  Meds: . antiseptic oral rinse  7 mL Mouth Rinse BID  . aspirin  81 mg Oral Daily  . atorvastatin  20 mg Oral q1800  . furosemide  60 mg Intravenous BID  . guaiFENesin  600 mg Oral BID  . heparin  5,000 Units Subcutaneous 3 times per day  . ipratropium-albuterol  3 mL Nebulization Q6H  . loratadine  10 mg Oral Daily  . pantoprazole  40 mg Oral Daily  . piperacillin-tazobactam (ZOSYN)  IV  2.25 g Intravenous Q8H  . predniSONE  40 mg Oral QAC breakfast  . sertraline  50 mg Oral Daily  . sodium chloride  3 mL Intravenous Q12H  . sodium chloride  3 mL Intravenous Q12H   Continuous Infusions: . sodium chloride 75 mL/hr at 03/24/14 1023    Active Problems:   Acute respiratory failure with hypoxia   AKI (acute kidney injury)   CKD (chronic kidney disease), stage III   CAP (community acquired pneumonia)  Anemia   Leukocytosis    Time spent: 30 min    [facility] Pager [phone] If 7PM-7AM, please contact night-coverage at [email or web address], password [facility] 03/24/2014, 10:46 AM  LOS: 3 days

## 2014-03-25 DIAGNOSIS — J690 Pneumonitis due to inhalation of food and vomit: Secondary | ICD-10-CM | POA: Diagnosis present

## 2014-03-25 DIAGNOSIS — K5900 Constipation, unspecified: Secondary | ICD-10-CM | POA: Diagnosis present

## 2014-03-25 DIAGNOSIS — E876 Hypokalemia: Secondary | ICD-10-CM | POA: Diagnosis not present

## 2014-03-25 DIAGNOSIS — D72829 Elevated white blood cell count, unspecified: Secondary | ICD-10-CM

## 2014-03-25 LAB — CBC WITH DIFFERENTIAL/PLATELET
BASOS PCT: 0 % (ref 0–1)
Basophils Absolute: 0 10*3/uL (ref 0.0–0.1)
Eosinophils Absolute: 0 10*3/uL (ref 0.0–0.7)
Eosinophils Relative: 0 % (ref 0–5)
HCT: 34.1 % — ABNORMAL LOW (ref 39.0–52.0)
Hemoglobin: 10.7 g/dL — ABNORMAL LOW (ref 13.0–17.0)
Lymphocytes Relative: 4 % — ABNORMAL LOW (ref 12–46)
Lymphs Abs: 0.7 10*3/uL (ref 0.7–4.0)
MCH: 24.6 pg — ABNORMAL LOW (ref 26.0–34.0)
MCHC: 31.4 g/dL (ref 30.0–36.0)
MCV: 78.4 fL (ref 78.0–100.0)
Monocytes Absolute: 1 10*3/uL (ref 0.1–1.0)
Monocytes Relative: 6 % (ref 3–12)
NEUTROS PCT: 90 % — AB (ref 43–77)
Neutro Abs: 14.4 10*3/uL — ABNORMAL HIGH (ref 1.7–7.7)
Platelets: 338 10*3/uL (ref 150–400)
RBC: 4.35 MIL/uL (ref 4.22–5.81)
RDW: 16.6 % — ABNORMAL HIGH (ref 11.5–15.5)
WBC: 16.1 10*3/uL — ABNORMAL HIGH (ref 4.0–10.5)

## 2014-03-25 LAB — BASIC METABOLIC PANEL
ANION GAP: 15 (ref 5–15)
BUN: 81 mg/dL — ABNORMAL HIGH (ref 6–23)
CO2: 30 mEq/L (ref 19–32)
Calcium: 7.7 mg/dL — ABNORMAL LOW (ref 8.4–10.5)
Chloride: 99 mEq/L (ref 96–112)
Creatinine, Ser: 3.09 mg/dL — ABNORMAL HIGH (ref 0.50–1.35)
GFR calc non Af Amer: 16 mL/min — ABNORMAL LOW (ref >90)
GFR, EST AFRICAN AMERICAN: 18 mL/min — AB (ref >90)
Glucose, Bld: 116 mg/dL — ABNORMAL HIGH (ref 70–99)
POTASSIUM: 3.4 meq/L — AB (ref 3.7–5.3)
SODIUM: 144 meq/L (ref 137–147)

## 2014-03-25 MED ORDER — HYDRALAZINE HCL 25 MG PO TABS
25.0000 mg | ORAL_TABLET | Freq: Three times a day (TID) | ORAL | Status: DC
  Administered 2014-03-25 – 2014-03-26 (×4): 25 mg via ORAL
  Filled 2014-03-25 (×4): qty 1

## 2014-03-25 MED ORDER — IPRATROPIUM-ALBUTEROL 0.5-2.5 (3) MG/3ML IN SOLN
3.0000 mL | RESPIRATORY_TRACT | Status: DC
  Administered 2014-03-25 – 2014-03-26 (×8): 3 mL via RESPIRATORY_TRACT
  Filled 2014-03-25 (×9): qty 3

## 2014-03-25 MED ORDER — POTASSIUM CHLORIDE CRYS ER 20 MEQ PO TBCR
40.0000 meq | EXTENDED_RELEASE_TABLET | Freq: Once | ORAL | Status: AC
  Administered 2014-03-25: 40 meq via ORAL
  Filled 2014-03-25: qty 2

## 2014-03-25 MED ORDER — POTASSIUM CHLORIDE IN NACL 20-0.45 MEQ/L-% IV SOLN
INTRAVENOUS | Status: DC
  Administered 2014-03-25 – 2014-03-27 (×6): via INTRAVENOUS
  Filled 2014-03-25 (×10): qty 1000

## 2014-03-25 MED ORDER — ACETAMINOPHEN 500 MG PO TABS: 500.0000 mg | ORAL_TABLET | Freq: Four times a day (QID) | ORAL | Status: DC | PRN

## 2014-03-25 NOTE — Progress Notes (Signed)
Foley was removed prior to PM despite aggressive diuretic therapy. Pt has defeated Condom cath and been wet on multiple, multiple, occurences despite a family provided sitter being present at all times. Pt bed has been changed totally on 3 occassions and partially on multiple other occassions

## 2014-03-25 NOTE — Evaluation (Addendum)
Occupational Therapy Evaluation Patient Details Name: Corey Cain MRN: [id number] DOB: 1914/05/30 Today's Date: 03/25/2014    History of Present Illness History of Present Illness:This is a 80 y.o. year old male with significant past medical history of colon ca s/p colectomy 2005, HTN presenting with acute resp failure w/ hypoxia. Per family, pt has had increased WOB, cough, wheezing at home over past 4-5 days. No fevers or chills. No nausea or vomiting. 1 sick contact with similar sxs. Cough mildly productive. Intermittent orthopnea. Trace edema. Pt primarily gets his care in [place]. Family is in process of transitioning care here. Family denies any hx/o CHF in the past. Denies excess salt intake.       Clinical Impression   Pt is presenting to acute OT with above situation.  He has generalized weakness in BUE.  Son indicates he pt has been needing increased assist with ADLs with increased weakness - per son report, pt was at a grossly modified independent/supervision level with ADLs prior, but is now presenting at a grossly mod assist level.  Pt will benefit from continued skilled OT services to address decreased strength and decreased ADL status.  Recommend SNF OT at this time.    Follow Up Recommendations  SNF    Equipment Recommendations  None recommended by OT (Defer to SNF)    Recommendations for Other Services       Precautions / Restrictions Precautions Precautions: Fall Restrictions Weight Bearing Restrictions: No      Mobility Bed Mobility                  Transfers                      Balance                                            ADL Overall ADL's : Needs assistance/impaired     Grooming: Brushing hair;Wash/dry hands;Minimal assistance Grooming Details (indicate cue type and reason): Daughter attempted to hlep pt comb hair - cued to allow pt to try.  pt able to open lotion bottle (with min assist) and rub in lotion  with cuing.                 Lower Body Dressing Details (indicate cue type and reason): Pt would not attempt to reach for socks.                      Vision                     Perception     Praxis      Pertinent Vitals/Pain Pain Assessment: No/denies pain     Hand Dominance Right   Extremity/Trunk Assessment Upper Extremity Assessment Upper Extremity Assessment: Generalized weakness   Lower Extremity Assessment Lower Extremity Assessment: Generalized weakness;Defer to PT evaluation       Communication Communication Communication: No difficulties   Cognition Arousal/Alertness: Awake/alert Behavior During Therapy: WFL for tasks assessed/performed Overall Cognitive Status: History of cognitive impairments - at baseline Area of Impairment: Orientation Orientation Level: Disoriented to;Person;Place;Time;Situation   Memory: Decreased short-term memory             General Comments       Exercises       Shoulder Instructions      Home  Living Family/patient expects to be discharged to:: Skilled nursing facility Living Arrangements: Children (Children and sitter stay with pt) Available Help at Discharge: Family;Personal care attendant                                Lives With: Family    Prior Functioning/Environment Level of Independence: Needs assistance  Gait / Transfers Assistance Needed: ambulated with a walker ADL's / Homemaking Assistance Needed: Pt with recent decreased strenght and ADL status.  prior, was able to ambulate, toilet, and eat with modified indepednence.  had been needing nicreased assist with dressing LE, ambulation, groomnig, and eating tasks recently.         OT Diagnosis: Generalized weakness   OT Problem List: Decreased range of motion;Decreased strength;Decreased safety awareness;Decreased cognition   OT Treatment/Interventions: Self-care/ADL training;Therapeutic exercise;Energy  conservation;DME and/or AE instruction;Therapeutic activities;Patient/family education    OT Goals(Current goals can be found in the care plan section) Acute Rehab OT Goals Patient Stated Goal: No OT goals stated OT Goal Formulation: With patient/family Time For Goal Achievement: 04/08/14 Potential to Achieve Goals: Good ADL Goals Pt Will Perform Grooming: with supervision Pt Will Transfer to Toilet: with min assist Pt/caregiver will Perform Home Exercise Program: Increased ROM;Increased strength;Both right and left upper extremity  OT Frequency: Min 2X/week   Barriers to D/C:            Co-evaluation              End of Session    Activity Tolerance: Patient tolerated treatment well Patient left: in chair;with family/visitor present;with nursing/sitter in room   Time: 1761-6073 OT Time Calculation (min): 25 min Charges:  OT General Charges $OT Visit: 1 Procedure  $OT: 1 Evaluation G-Codes:     Bea Graff Janisse Ghan, MS, OTR/L [place] 03/25/2014, 3:05 PM

## 2014-03-25 NOTE — Evaluation (Signed)
Physical Therapy Evaluation Patient Details Name: Corey Cain MRN: [id number] DOB: 14-Aug-1914 Today's Date: 03/25/2014   History of Present Illness   Pt is a 80 year old male with a hx of colon CA and HTN who was admitted for treatment of respiratory failure stemming from pneumonia.  He lives in his own home where family rotates in to stay with him.  He normally ambulates with a walker without assist, family assists with bathing and dressing (son states he is "spoiled").  Clinical Impression   Pt was seen for an evaluation.  He was very alert and cooperative, on 1.5 L O2.  He was found to be very deconditioned from baseline and is now unable to ambulate.  He needed mod assist to transfer bed to chair.  He becomes dyspneic with minimal exertion although O2 sats remain in the 90s.  Once seated he was instructed in seated marching and long arc quad.  Pt would benefit from SNF at d/c and pt/son are in agreement.    Follow Up Recommendations SNF    Equipment Recommendations  None recommended by PT    Recommendations for Other Services   OT    Precautions / Restrictions Precautions Precautions: Fall Restrictions Weight Bearing Restrictions: No      Mobility  Bed Mobility Overal bed mobility: Needs Assistance Bed Mobility: Supine to Sit     Supine to sit: Mod assist;HOB elevated        Transfers Overall transfer level: Needs assistance Equipment used: Rolling walker (2 wheeled) Transfers: Sit to/from [facility] Sit to Stand: Mod assist Stand pivot transfers: Mod assist          Ambulation/Gait Ambulation/Gait assistance:  (unable)              Stairs            Wheelchair Mobility    Modified Rankin (Stroke Patients Only)       Balance Overall balance assessment: Needs assistance Sitting-balance support: No upper extremity supported Sitting balance-Leahy Scale: Good     Standing balance support: Bilateral upper extremity  supported Standing balance-Leahy Scale: Fair Standing balance comment: 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 which causes him to lean right                             Pertinent Vitals/Pain Pain Assessment: No/denies pain    Home Living Family/patient expects to be discharged to:: Skilled nursing facility                      Prior Function Level of Independence: Needs assistance   Gait / Transfers Assistance Needed: ambulated with a walker  ADL's / Homemaking Assistance Needed: assist with dressing and bathing---family takes care of household duties        Hand Dominance     right   Extremity/Trunk Assessment   Upper Extremity Assessment: Generalized weakness           Lower Extremity Assessment: Generalized weakness      Cervical / Trunk Assessment: Kyphotic  Communication   Communication: No difficulties  Cognition Arousal/Alertness: Awake/alert Behavior During Therapy: WFL for tasks assessed/performed Overall Cognitive Status: Within Functional Limits for tasks assessed       Memory: Decreased short-term memory              General Comments      Exercises  marching and long arc  quad       Assessment/Plan    PT Assessment Patient needs continued PT services  PT Diagnosis Difficulty walking;Generalized weakness   PT Problem List Decreased strength;Decreased activity tolerance;Decreased balance;Decreased mobility  PT Treatment Interventions Gait training;Therapeutic activities;Therapeutic exercise;Balance training   PT Goals (Current goals can be found in the Care Plan section)      Frequency Min 3X/week   Barriers to discharge   family plans to take pt home once he is ambulatory    Co-evaluation               End of Session Equipment Utilized During Treatment: Gait belt Activity Tolerance: Patient limited by fatigue Patient left: in chair;with call bell/phone within reach;with family/visitor  present Nurse Communication: Mobility status         Time: 1884-1660 PT Time Calculation (min) (ACUTE ONLY): 42 min   Charges:   PT Evaluation $Initial PT Evaluation Tier I: 1 Procedure     PT G CodesDemetrios Isaacs L 03/25/2014, 10:52 AM

## 2014-03-25 NOTE — Care Management Utilization Note (Signed)
UR review complete.  

## 2014-03-25 NOTE — Evaluation (Signed)
Clinical/Bedside Swallow Evaluation Patient Details  Name: Corey Cain MRN: [id number] DOB: 05-20-1914  Today's Date: 03/25/2014 Time: 12:00  - 12:31 PM    Past Medical History:  Past Medical History  Diagnosis Date  . Cancer   . Colon cancer approx 2005    colon resection with resolution  . AKI (acute kidney injury) 03/21/2014   Past Surgical History: History reviewed. No pertinent past surgical history. HPI:  Corey Cain  is a 80 y.o. year old male presenting with Acute resp failure w/ hypoxia secondary to pneumonia and acute diastolic CHF. He was admitted to telemetry and starte don IV antibiotics empirically for pneumonia. Cultures are pending. Echocardiogram done and on strict intake and output. Cardiac enzymes are negative and EKG does not show any ischemic changes. Echocardiogram shows grade 1 diastolic dysfunction and elevated pulmonary artery pressures. Continue nasal canula oxygen to keep sats>90%. Urine strep negative. ABg looks okay. Repeat CXR shows increasing edema and congestive heart failure with increasing density in the right upper lobe concerning for aspiration pneumonia. He is on IV lasix for diuresis and antibiotics changed to IV zosyn. Mr. Colavito son present in room for clinical swallow evaluation and reports that his father has "a great appetite and no difficulty swallowing". He consumes all meals at home upright in a chair and masticates his medications if they are larger per son.    Assessment/Recommendations/Treatment Plan    SLP Assessment Clinical Impression Statement: Pt with mild wet vocal quality and congested cough upon SLP arrival. Oral motor examination is unremarkable except for mild decreased lingual coordination with lateralization. Dentition intact and pt reportedly with a strong appetite. Laryngeal excursion feels mildy reduced. Pt presented with delayed congested cough after his first few sips of water, however repeated trials via cup and  straw (self presented) were tolerated well. No oral residuals noted post swallow with solids and pt without report of stasis in pharynx. Pt encouraged to swallow 2x after each sip of liquid and able to return demonstrate. Son reports no history if PNA in recent past. Do not suspect aspiration, however difficult to rule out at bedside. SLP will follow for results of repeat chest x-ray (son says it was ordered today, will confirm) and check pt tomorrow to assess need for MBSS to objectively evaluate swallow function. Continue diet as ordered with standard aspiration precautions. Above to RN, pt, and son.   Risk for Aspiration: Mild Other Related Risk Factors: History of pneumonia, History of esophageal-related issues, Decreased respiratory status  Swallow Evaluation Recommendations Diet Recommendations: Regular, Thin liquid Liquid Administration via: Cup, Straw Medication Administration: Whole meds with liquid Supervision: Intermittent supervision to cue for compensatory strategies (may need feeder assist at this time due to tremor) Compensations: Slow rate, Multiple dry swallows after each bite/sip Postural Changes and/or Swallow Maneuvers: Out of bed for meals, Seated upright 90 degrees, Upright 30-60 min after meal Oral Care Recommendations: Oral care BID Other Recommendations: Clarify dietary restrictions Follow up Recommendations: Skilled Nursing facility  Treatment Plan Treatment Plan Recommendations: Therapy as outlined in treatment plan below Speech Therapy Frequency (ACUTE ONLY): min 2x/week Treatment Duration: 1 week Interventions: Aspiration precaution training, Compensatory techniques, Patient/family education, Diet toleration management by SLP  Prognosis Prognosis for Safe Diet Advancement: Good  Individuals Consulted Consulted and Agree with Results and Recommendations: Patient, Family Midwife, RN Family Member Consulted: Son  Swallowing Goals    Pt will demonstrate  safe and efficient consumption of least restrictive diet with use of strategies as needed.  Swallow Study Prior Functional Status   Lives at home with son.  General  Date of Onset: 03/21/14 HPI: Corey Cain  is a 80 y.o. year old male presenting with Acute resp failure w/ hypoxia secondary to pneumonia and acute diastolic CHF. He was admitted to telemetry and starte don IV antibiotics empirically for pneumonia. Cultures are pending. Echocardiogram done and on strict intake and output. Cardiac enzymes are negative and EKG does not show any ischemic changes. Echocardiogram shows grade 1 diastolic dysfunction and elevated pulmonary artery pressures. Continue nasal canula oxygen to keep sats>90%. Urine strep negative. ABg looks okay. Repeat CXR shows increasing edema and congestive heart failure with increasing density in the right upper lobe concerning for aspiration pneumonia. He is on IV lasix for diuresis and antibiotics changed to IV zosyn. Corey Cain son present in room for clinical swallow evaluation and reports that his father has "a great appetite and no difficulty swallowing". He consumes all meals at home upright in a chair and masticates his medications if they are larger per son.  Type of Study: Bedside swallow evaluation Previous Swallow Assessment: None on record; son reports h/o esophageal dilation in the past Diet Prior to this Study: Regular, Thin liquids Temperature Spikes Noted: No Respiratory Status: Nasal cannula History of Recent Intubation: No Behavior/Cognition: Alert, Cooperative, Pleasant mood Oral Cavity - Dentition: Adequate natural dentition Self-Feeding Abilities: Needs set up Patient Positioning: Upright in chair Baseline Vocal Quality: Low vocal intensity, Clear (can hear congestion) Volitional Cough: Congested Volitional Swallow: Able to elicit  Oral Motor/Sensory Function  Overall Oral Motor/Sensory Function: Appears within functional limits for tasks  assessed Labial ROM: Within Functional Limits Labial Symmetry: Within Functional Limits Labial Strength: Within Functional Limits Labial Sensation: Within Functional Limits Lingual ROM: Other (Comment) (reduced lateralization bilaterally, but able to lick lips ) Lingual Symmetry: Within Functional Limits Lingual Strength: Within Functional Limits Lingual Sensation: Within Functional Limits Facial ROM: Within Functional Limits Facial Symmetry: Within Functional Limits Facial Strength: Within Functional Limits Facial Sensation: Within Functional Limits Velum: Within Functional Limits Mandible: Within Functional Limits  Consistency Results  Ice Chips Ice chips: Within functional limits Presentation: Spoon  Thin Liquid Thin Liquid: Impaired Presentation: Cup;Self Fed;Straw;Spoon Pharyngeal  Phase Impairments: Cough - Delayed (delayed cough after initial presentation only)  Nectar Thick Liquid Nectar Thick Liquid: Not tested  Honey Thick Liquid Honey Thick Liquid: Not tested  Puree Puree: Within functional limits Presentation: Spoon  Solid Solid: Within functional limits Presentation: Self Fed  Thank you,  Genene Churn, [place]  PORTER,DABNEY 03/25/2014,12:55 PM

## 2014-03-25 NOTE — Progress Notes (Signed)
[facility] PROGRESS NOTE  Corey Cain IRJ:[id number] DOB: 1915/03/30 DOA: 03/21/2014 PCP: No primary care provider on file.  Assessment/Plan: 1. Acute respiratory failure with hypoxia secondary to  pneumonia and Acute diastolic Chf.  He was admitted to telemetry and starte don IV antibiotics empirically for pneumonia. Cultures are pending. Echocardiogram done and on strict intake and output. Cardiac enzymes are negative and EKG does not show any ischemic changes. Echocardiogram shows grade 1 diastolic dysfunction and elevated pulmonary artery pressures.  Continue nasal canula oxygen to keep sats>90%. Urine strep negative. ABg looks okay. Repeat CXR shows increasing edema and congestive heart failure with increasing density in the right upper lobe concerning for aspiration pneumonia. He is on IV lasix for diuresis and antibiotics changed to IV zosyn. Wean his off the oxygen,.  Watching renal parameters on IV lasix. Cardiology consulted to assist [place] in managing diastolic CHF.    Renal failure/ ? Acute vs CKD: UA, and US renal ordered. They are negative for infection and obstruction respectively.  Renal insufficiency probably CKD stage 3..  Slight worsening of the renal function after the lasix. . Renal consulted and recommendations given.   Hypertension   Better controlled.   Leukocytosis:  possibly from the pneumonia and steroids.Continue to monitor.   Mild anemia: Normocytic. Probably anemia of chronic disease.   Constipation: Colace added to the medications.   Hypokalemia: replete as needed   Code Status: full code Family Communication: family at bedside Disposition Plan:transfer him to telemetry and SNF on discharge.   Consultants: Physical therapy Speech eval.  Renal cardiology Procedures:  echocardiogram  Antibiotics:  Rocephin 11/12  zithromax 11/12  HPI/Subjective:  he is more alert today and oriented. Able to answer questions. Wheezing has improved.  Good apettite.  BM today.  Objective: Filed Vitals:   03/25/14 1042  BP: 150/52  Pulse:   Temp:   Resp:     Intake/Output Summary (Last 24 hours) at 03/25/14 1144 Last data filed at 03/25/14 0900  Gross per 24 hour  Intake   1565 ml  Output   1800 ml  Net   -235 ml   Filed Weights   03/23/14 0500 03/24/14 0500 03/25/14 0500  Weight: 80.8 kg (178 lb 2.1 oz) 81.3 kg (179 lb 3.7 oz) 77.8 kg (171 lb 8.3 oz)    Exam:   General:  Alert afebrile comfortable laying on the bed on 2 lit [place] oxygen.  Cardiovascular: s1s2  Respiratory:scattered rales heard.   Abdomen: soft non tender non distended bowel sounds heard  Musculoskeletal: pedal edema +1  Data Reviewed: Basic Metabolic Panel:  Recent Labs Lab 03/21/14 0500 03/22/14 0521 03/23/14 0500 03/24/14 0517 03/25/14 0521  NA 143 143 142 141 144  K 4.4 4.4 4.3 3.8 3.4*  CL 103 102 100 100 99  CO2 26 26 26 26 30   GLUCOSE 144* 105* 144* 91 116*  BUN 46* 49* 62* 77* 81*  CREATININE 2.56* 2.48* 2.63* 2.95* 3.09*  CALCIUM 8.8 8.7 8.5 7.7* 7.7*   Liver Function Tests:  Recent Labs Lab 03/21/14 0500 03/22/14 0521  AST 45* 36  ALT 53 43  ALKPHOS 237* 218*  BILITOT 0.2* 0.2*  PROT 7.2 6.9  ALBUMIN 2.9* 2.7*   No results for input(s): LIPASE, AMYLASE in the last 168 hours. No results for input(s): AMMONIA in the last 168 hours. CBC:  Recent Labs Lab 03/21/14 0500 03/22/14 0521 03/23/14 0500 03/24/14 0517 03/25/14 0521  WBC 13.6* 12.1* 11.9* 17.3* 16.1*  NEUTROABS 12.3*  10.6* 11.4* 15.3* 14.4*  HGB 11.0* 10.7* 10.6* 10.3* 10.7*  HCT 35.0* 34.1* 34.3* 31.6* 34.1*  MCV 80.3 79.5 79.6 78.4 78.4  PLT 358 313 335 331 338   Cardiac Enzymes:  Recent Labs Lab 03/21/14 0228 03/21/14 0500 03/21/14 0936 03/21/14 1548  TROPONINI <0.30 <0.30 <0.30 <0.30   BNP (last 3 results)  Recent Labs  03/21/14 0228  PROBNP 2695.0*   CBG: No results for input(s): GLUCAP in the last 168 hours.  Recent Results  (from the past 240 hour(s))  Culture, blood (routine x 2)     Status: None (Preliminary result)   Collection Time: 03/21/14  3:30 AM  Result Value Ref Range Status   Specimen Description BLOOD RIGHT ANTECUBITAL  Final   Special Requests BOTTLES DRAWN AEROBIC AND ANAEROBIC 10CC EACH  Final   Culture NO GROWTH 4 DAYS  Final   Report Status PENDING  Incomplete  Culture, blood (routine x 2)     Status: None (Preliminary result)   Collection Time: 03/21/14  3:35 AM  Result Value Ref Range Status   Specimen Description BLOOD LEFT ANTECUBITAL  Final   Special Requests   Final    BOTTLES DRAWN AEROBIC AND ANAEROBIC AEB=4CC ANA=2CC   Culture NO GROWTH 4 DAYS  Final   Report Status PENDING  Incomplete  MRSA PCR Screening     Status: None   Collection Time: 03/22/14 11:33 AM  Result Value Ref Range Status   MRSA by PCR NEGATIVE NEGATIVE Final    Comment:        The GeneXpert MRSA Assay (FDA approved for NASAL specimens only), is one component of a comprehensive MRSA colonization surveillance program. It is not intended to diagnose MRSA infection nor to guide or monitor treatment for MRSA infections.      Studies: Dg Chest Port 1 View  03/24/2014   CLINICAL DATA:  Pneumonia.  Subsequent encounter.  EXAM: PORTABLE CHEST - 1 VIEW  COMPARISON:  03/22/2014.  FINDINGS: There is more volume loss in the RIGHT hemithorax with elevation of the RIGHT hemidiaphragm. RIGHT midlung consolidation remains present although difficult to appreciate based on the lower lung volumes. Cardiomediastinal contours appear unchanged allowing for rotation of the RIGHT on today's examination. Monitoring leads project over the chest. LEFT lung appears clear aside from LEFT basilar atelectasis.  IMPRESSION: 1. Lower lung volumes on the RIGHT with elevation of the RIGHT hemidiaphragm. 2. Persistent airspace disease in the RIGHT mid lung, compatible with pneumonia. Asymmetric/atypical edema may also be present.    Electronically Signed   By: Dereck Ligas M.D.   On: 03/24/2014 12:50    Scheduled Meds: . antiseptic oral rinse  7 mL Mouth Rinse BID  . aspirin  81 mg Oral Daily  . atorvastatin  20 mg Oral q1800  . docusate sodium  100 mg Oral BID  . furosemide  60 mg Intravenous BID  . guaiFENesin  600 mg Oral BID  . heparin  5,000 Units Subcutaneous 3 times per day  . hydrALAZINE  25 mg Oral 3 times per day  . ipratropium-albuterol  3 mL Nebulization Q4H  . loratadine  10 mg Oral Daily  . pantoprazole  40 mg Oral Daily  . piperacillin-tazobactam (ZOSYN)  IV  2.25 g Intravenous Q8H  . predniSONE  40 mg Oral QAC breakfast  . sertraline  50 mg Oral Daily  . sodium chloride  3 mL Intravenous Q12H  . sodium chloride  3 mL Intravenous Q12H   Continuous  Infusions: . sodium chloride 75 mL/hr at 03/24/14 1600    Active Problems:   Acute respiratory failure with hypoxia   AKI (acute kidney injury)   CKD (chronic kidney disease), stage III   CAP (community acquired pneumonia)   Anemia   Leukocytosis    Time spent: 25 min    [facility] Pager [phone] If 7PM-7AM, please contact night-coverage at [email or web address], password [facility] 03/25/2014, 11:44 AM  LOS: 4 days

## 2014-03-25 NOTE — Clinical Documentation Improvement (Signed)
Please specify acuity of diastolic CHF (acute, chronic, acute on chronic) and document in your progress note and carry over to the discharge summary.    Thank you, Mateo Flow, RN [phone] Clinical Documentation Specialist

## 2014-03-25 NOTE — Progress Notes (Signed)
Subjective: Interval History: has no complaint of nausea or vomiting. Patient also denies any difficulty in breathing..  Objective: Vital signs in last 24 hours: Temp:  [97.3 F (36.3 C)-98.7 F (37.1 C)] 97.3 F (36.3 C) (11/16 0809) Pulse Rate:  [83-102] 97 (11/16 1200) Resp:  [18-30] 24 (11/16 1200) BP: (142-173)/(48-123) 150/123 mmHg (11/16 1200) SpO2:  [93 %-100 %] 96 % (11/16 1200) Weight:  [77.8 kg (171 lb 8.3 oz)] 77.8 kg (171 lb 8.3 oz) (11/16 0500) Weight change: -3.5 kg (-7 lb 11.5 oz)  Intake/Output from previous day: 11/15 0701 - 11/16 0700 In: 1711.3 [P.O.:240; I.V.:1321.3; IV Piggyback:150] Out: 1975 [ACZYS:0630] Intake/Output this shift: Total I/O In: 50 [IV Piggyback:50] Out: 725 [Urine:725]  General appearance: alert, cooperative and no distress Resp: clear to auscultation bilaterally Cardio: regular rate and rhythm, S1, S2 normal, no murmur, click, rub or gallop GI: soft, non-tender; bowel sounds normal; no masses,  no organomegaly Extremities: extremities normal, atraumatic, no cyanosis or edema  Lab Results:  Recent Labs  03/24/14 0517 03/25/14 0521  WBC 17.3* 16.1*  HGB 10.3* 10.7*  HCT 31.6* 34.1*  PLT 331 338   BMET:  Recent Labs  03/24/14 0517 03/25/14 0521  NA 141 144  K 3.8 3.4*  CL 100 99  CO2 26 30  GLUCOSE 91 116*  BUN 77* 81*  CREATININE 2.95* 3.09*  CALCIUM 7.7* 7.7*   No results for input(s): PTH in the last 72 hours. Iron Studies: No results for input(s): IRON, TIBC, TRANSFERRIN, FERRITIN in the last 72 hours.  Studies/Results: Dg Chest Port 1 View  03/24/2014   CLINICAL DATA:  Pneumonia.  Subsequent encounter.  EXAM: PORTABLE CHEST - 1 VIEW  COMPARISON:  03/22/2014.  FINDINGS: There is more volume loss in the RIGHT hemithorax with elevation of the RIGHT hemidiaphragm. RIGHT midlung consolidation remains present although difficult to appreciate based on the lower lung volumes. Cardiomediastinal contours appear unchanged  allowing for rotation of the RIGHT on today's examination. Monitoring leads project over the chest. LEFT lung appears clear aside from LEFT basilar atelectasis.  IMPRESSION: 1. Lower lung volumes on the RIGHT with elevation of the RIGHT hemidiaphragm. 2. Persistent airspace disease in the RIGHT mid lung, compatible with pneumonia. Asymmetric/atypical edema may also be present.   Electronically Signed   By: Dereck Ligas M.D.   On: 03/24/2014 12:50    I have reviewed the patient's current medications.  Assessment/Plan: Problem #1 acute kidney injury: His BUN and creatinine seems to be worsening. However his urine output has increased. Presently patient appetite is good and he doesn't have any nausea vomiting. Problem #2 chronic kidney disease: Stage III Problem #3 hypokalemia: Potassium is low most likely from diuretics Problem #4 difficulty in breathing: Possibly a combination of pneumonia/pleural effusion/CHF presently seems to be getting better. Problem #5 hypertension: Her blood pressure is reasonably controlled Problem #6 anemia Problem #7 metabolic bone disease his calcium is range  Plan: We'll change his IV fluid to half normal saline with 20 mEq of KCl at 1 25 mL per hour We'll check his pain is metabolic panel, phosphorus and iron studies in the morning.   LOS: 4 days   Danaiya Steadman S 03/25/2014,1:02 PM

## 2014-03-26 ENCOUNTER — Inpatient Hospital Stay (HOSPITAL_COMMUNITY): Payer: Medicare FFS

## 2014-03-26 ENCOUNTER — Encounter (HOSPITAL_COMMUNITY): Payer: Self-pay | Admitting: Adult Health

## 2014-03-26 DIAGNOSIS — I5021 Acute systolic (congestive) heart failure: Secondary | ICD-10-CM

## 2014-03-26 DIAGNOSIS — J690 Pneumonitis due to inhalation of food and vomit: Principal | ICD-10-CM

## 2014-03-26 DIAGNOSIS — E876 Hypokalemia: Secondary | ICD-10-CM

## 2014-03-26 LAB — FERRITIN: Ferritin: 439 ng/mL — ABNORMAL HIGH (ref 22–322)

## 2014-03-26 LAB — BASIC METABOLIC PANEL
ANION GAP: 18 — AB (ref 5–15)
BUN: 83 mg/dL — ABNORMAL HIGH (ref 6–23)
CALCIUM: 7.6 mg/dL — AB (ref 8.4–10.5)
CO2: 27 meq/L (ref 19–32)
CREATININE: 2.97 mg/dL — AB (ref 0.50–1.35)
Chloride: 96 mEq/L (ref 96–112)
GFR calc Af Amer: 19 mL/min — ABNORMAL LOW (ref >90)
GFR calc non Af Amer: 16 mL/min — ABNORMAL LOW (ref >90)
Glucose, Bld: 114 mg/dL — ABNORMAL HIGH (ref 70–99)
Potassium: 4 mEq/L (ref 3.7–5.3)
Sodium: 141 mEq/L (ref 137–147)

## 2014-03-26 LAB — CBC WITH DIFFERENTIAL/PLATELET
BASOS ABS: 0 10*3/uL (ref 0.0–0.1)
Basophils Relative: 0 % (ref 0–1)
EOS PCT: 0 % (ref 0–5)
Eosinophils Absolute: 0 10*3/uL (ref 0.0–0.7)
HEMATOCRIT: 34.4 % — AB (ref 39.0–52.0)
Hemoglobin: 11 g/dL — ABNORMAL LOW (ref 13.0–17.0)
LYMPHS PCT: 5 % — AB (ref 12–46)
Lymphs Abs: 0.8 10*3/uL (ref 0.7–4.0)
MCH: 24.6 pg — ABNORMAL LOW (ref 26.0–34.0)
MCHC: 32 g/dL (ref 30.0–36.0)
MCV: 77 fL — AB (ref 78.0–100.0)
MONO ABS: 0.8 10*3/uL (ref 0.1–1.0)
Monocytes Relative: 5 % (ref 3–12)
Neutro Abs: 14.3 10*3/uL — ABNORMAL HIGH (ref 1.7–7.7)
Neutrophils Relative %: 90 % — ABNORMAL HIGH (ref 43–77)
Platelets: 318 10*3/uL (ref 150–400)
RBC: 4.47 MIL/uL (ref 4.22–5.81)
RDW: 16.7 % — ABNORMAL HIGH (ref 11.5–15.5)
WBC: 15.9 10*3/uL — AB (ref 4.0–10.5)

## 2014-03-26 LAB — IRON AND TIBC
IRON: 39 ug/dL — AB (ref 42–135)
Saturation Ratios: 16 % — ABNORMAL LOW (ref 20–55)
TIBC: 242 ug/dL (ref 215–435)
UIBC: 203 ug/dL (ref 125–400)

## 2014-03-26 LAB — CULTURE, BLOOD (ROUTINE X 2)
CULTURE: NO GROWTH
Culture: NO GROWTH

## 2014-03-26 LAB — PHOSPHORUS: PHOSPHORUS: 4.2 mg/dL (ref 2.3–4.6)

## 2014-03-26 MED ORDER — IPRATROPIUM-ALBUTEROL 0.5-2.5 (3) MG/3ML IN SOLN
3.0000 mL | Freq: Four times a day (QID) | RESPIRATORY_TRACT | Status: DC
  Administered 2014-03-26 – 2014-03-28 (×7): 3 mL via RESPIRATORY_TRACT
  Filled 2014-03-26 (×9): qty 3

## 2014-03-26 MED ORDER — ALBUTEROL SULFATE (2.5 MG/3ML) 0.083% IN NEBU
2.5000 mg | INHALATION_SOLUTION | RESPIRATORY_TRACT | Status: DC | PRN
Start: 2014-03-26 — End: 2014-03-30

## 2014-03-26 MED ORDER — HYDRALAZINE HCL 25 MG PO TABS
50.0000 mg | ORAL_TABLET | Freq: Three times a day (TID) | ORAL | Status: DC
  Administered 2014-03-26 – 2014-03-30 (×11): 50 mg via ORAL
  Filled 2014-03-26 (×11): qty 2

## 2014-03-26 NOTE — Clinical Social Work Placement (Signed)
Clinical Social Work Department CLINICAL SOCIAL WORK PLACEMENT NOTE 03/26/2014  Patient:  TYDE, LAMISON  Account Number:  [id number] Admit date:  03/21/2014  Clinical Social Worker:  Ambrose Pancoast, LCSW  Date/time:  03/26/2014 11:40 AM  Clinical Social Work is seeking post-discharge placement for this patient at the following level of care:   [place]   (*CSW will update this form in Epic as items are completed)   03/26/2014  Patient/family provided with [place] Department of Clinical Social Work's list of facilities offering this level of care within the geographic area requested by the patient (or if unable, by the patient's family).  03/26/2014  Patient/family informed of their freedom to choose among providers that offer the needed level of care, that participate in Medicare, Medicaid or managed care program needed by the patient, have an available bed and are willing to accept the patient.  03/26/2014  Patient/family informed of [facility]' ownership interest in [facility], as well as of the fact that they are under no obligation to receive care at this facility.  PASARR submitted to EDS on 03/26/2014 PASARR number received on 03/26/2014  FL2 transmitted to all facilities in geographic area requested by pt/family on  03/26/2014 FL2 transmitted to all facilities within larger geographic area on   Patient informed that his/her managed care company has contracts with or will negotiate with  certain facilities, including the following:     Patient/family informed of bed offers received:  03/26/2014 Patient chooses bed at [facility] Physician recommends and patient chooses bed at  [facility]  Patient to be transferred to [facility] on   Patient to be transferred to facility by  Patient and family notified of transfer on  Name of family member notified:    The following physician request were entered in Epic:   Additional  Comments: CSW advised patient's son that patient's [facility] authorization can take up to 24 hours.  Patient's son gave CSW permission to fax clinicals to both [facility] and [facility].  Benay Pike, [place]

## 2014-03-26 NOTE — Clinical Social Work Psychosocial (Signed)
Clinical Social Work Department BRIEF PSYCHOSOCIAL ASSESSMENT 03/26/2014  Patient:  WASSIM, KIRKSEY     Account Number:  [id number]     Admit date:  03/21/2014  Clinical Social Worker:  Legrand Como  Date/Time:  03/26/2014 11:19 AM  Referred by:  CSW  Date Referred:  03/25/2014 Referred for  SNF Placement   Other Referral:   Interview type:  Patient Other interview type:   Patient,  [place], [place]  [phone]    PSYCHOSOCIAL DATA Living Status:  ALONE Admitted from facility:   Level of care:   Primary support name:  Wilburn Keir and siblings Primary support relationship to patient:  CHILD, ADULT Degree of support available:   Family is very involved and supportive    CURRENT CONCERNS Current Concerns  Post-Acute Placement   Other Concerns:    SOCIAL WORK ASSESSMENT / PLAN CSW met with patient and son, Paulino Cork.  Patient did not participate in assessment as he was asleep and could not be aroused.  Patient's son, Alson Mcpheeters, indicated that patient lives alone with family and paid sitters coming in to assist him 24 hours a day.  Patient's son indicated that patient has had 24 hour a day assistance for the past nine months.  He indicated that prior to that family would stay with patient several hours a day. Patient's son indicated that patient was totally independent up until about the last year.  He indicated that patient was previously able to bathe, dress, and fix breakfast (all ADLs) for himself about a year ago. Patient's son indicated that patient ambulates with the assistance of a walker to assist with balance.  He stated that the family began to assist patient 24 hours per day due to patient having increased episodes of falling. Patient's son indicated that patient's daughter in law reported that patient has had some swallowing and choking issues recently particularly with chocolate milk. Patient's son indicated that patient "loves chocolate milk" but  they don't give it to him anymore due to his choking. CSW spoke with patient's son about patient's need to have go to a SNF based on PT's recommendation.  CSW provided patient's son with a SNF list.  Patient's son indicated that he would review the list with patient's daughters. Patient's son indicated that he lives in [place] and that a facility in [place] would be closer for him.  CSW pointed out the two skilled facilities in the [place] area.  CSW advised that an authorization would need to be obtained for the placement.  Patient's son indicated that he would speak to his sister's (patient's daughters) about the location they preferred and let CSW know their decision.   Assessment/plan status:  Information/Referral to [employer] Other assessment/ plan:   Information/referral to community resources:   SNF list    PATIENT'S/FAMILY'S RESPONSE TO PLAN OF CARE: Patient's family is agreeable for patient to go to SNF upon discharge for physical therapy.    Ambrose Pancoast, [place]

## 2014-03-26 NOTE — Progress Notes (Signed)
Speech Language Pathology:    Patient Details Name: Corey Cain MRN: [id number] DOB: 09-12-1914 Today's Date: 03/26/2014 Time:  - 1:23 PM    SLP spoke with pt's son at bedside (pt sleeping) and discussed completing MBSS. He said he spoke with the doctor and agree it is appropriate to rule out aspiration and identify appropriate textures/consistencies as needed. SLP will order and complete this afternoon.              Thank you,  Genene Churn, [place]      [place] 03/26/2014, 1:23 PM

## 2014-03-26 NOTE — Progress Notes (Signed)
[facility] PROGRESS NOTE  Corey Cain YBO:[id number] DOB: [birth year] DOA: 03/21/2014 PCP: No primary care provider on file.  Assessment/Plan: 1. Acute respiratory failure with hypoxia secondary to  pneumonia and Acute diastolic Chf.  He was admitted to telemetry and starte don IV antibiotics empirically for pneumonia. Cultures are pending. Echocardiogram done and on strict intake and output. Cardiac enzymes are negative and EKG does not show any ischemic changes. Echocardiogram shows grade 1 diastolic dysfunction and elevated pulmonary artery pressures.  Continue nasal canula oxygen to keep sats>90%. Urine strep negative. ABg looks okay. Repeat CXR shows increasing edema and congestive heart failure with increasing density in the right upper lobe concerning for aspiration pneumonia. Speech evaluation and possible MBS today.  He is on IV lasix for diuresis and antibiotics changed to IV zosyn. Wean his off the oxygen,.  Watching renal parameters on IV lasix. Cardiology consulted , and recommended to continue the same treatment and increase his bp meds.    Renal failure/ ? Acute vs CKD: UA, and US renal ordered. They are negative for infection and obstruction respectively.  Renal insufficiency probably CKD stage 3..  Slight worsening of the renal function after the lasix. . Renal consulted and recommendations given. Plan to change to oral lasix in a day   Hypertension   Better controlled.   Leukocytosis:  possibly from the pneumonia and steroids.Continue to monitor.   Mild anemia: Normocytic. Probably anemia of chronic disease.   Constipation: Colace added to the medications.   Hypokalemia: replete as needed   Code Status: full code Family Communication: family at bedside Disposition Plan:transfer him to telemetry and SNF on discharge. In a day or 2.  Consultants: Physical therapy Speech eval.  Renal cardiology Procedures:  echocardiogram  Antibiotics:  Rocephin  11/12  zithromax 11/12  HPI/Subjective: Wheezing resolved. He was slightly restless last night and catching up on his sleep.    Objective: Filed Vitals:   03/26/14 1314  BP: 180/66  Pulse:   Temp:   Resp:     Intake/Output Summary (Last 24 hours) at 03/26/14 1414 Last data filed at 03/26/14 1027  Gross per 24 hour  Intake 2712.5 ml  Output    920 ml  Net 1792.5 ml   Filed Weights   03/24/14 0500 03/25/14 0500 03/26/14 0300  Weight: 81.3 kg (179 lb 3.7 oz) 77.8 kg (171 lb 8.3 oz) 78.7 kg (173 lb 8 oz)    Exam:   General:  Alert afebrile comfortable laying on the bed on 2 lit [place] oxygen.  Cardiovascular: s1s2  Respiratory:scattered rales heard.   Abdomen: soft non tender non distended bowel sounds heard  Musculoskeletal: pedal edema +1  Data Reviewed: Basic Metabolic Panel:  Recent Labs Lab 03/22/14 0521 03/23/14 0500 03/24/14 0517 03/25/14 0521 03/26/14 0633 03/26/14 0635  NA 143 142 141 144  --  141  K 4.4 4.3 3.8 3.4*  --  4.0  CL 102 100 100 99  --  96  CO2 26 26 26 30   --  27  GLUCOSE 105* 144* 91 116*  --  114*  BUN 49* 62* 77* 81*  --  83*  CREATININE 2.48* 2.63* 2.95* 3.09*  --  2.97*  CALCIUM 8.7 8.5 7.7* 7.7*  --  7.6*  PHOS  --   --   --   --  4.2  --    Liver Function Tests:  Recent Labs Lab 03/21/14 0500 03/22/14 0521  AST 45* 36  ALT 53 43  ALKPHOS 237* 218*  BILITOT 0.2* 0.2*  PROT 7.2 6.9  ALBUMIN 2.9* 2.7*   No results for input(s): LIPASE, AMYLASE in the last 168 hours. No results for input(s): AMMONIA in the last 168 hours. CBC:  Recent Labs Lab 03/22/14 0521 03/23/14 0500 03/24/14 0517 03/25/14 0521 03/26/14 0635  WBC 12.1* 11.9* 17.3* 16.1* 15.9*  NEUTROABS 10.6* 11.4* 15.3* 14.4* 14.3*  HGB 10.7* 10.6* 10.3* 10.7* 11.0*  HCT 34.1* 34.3* 31.6* 34.1* 34.4*  MCV 79.5 79.6 78.4 78.4 77.0*  PLT 313 335 331 338 318   Cardiac Enzymes:  Recent Labs Lab 03/21/14 0228 03/21/14 0500 03/21/14 0936  03/21/14 1548  TROPONINI <0.30 <0.30 <0.30 <0.30   BNP (last 3 results)  Recent Labs  03/21/14 0228  PROBNP 2695.0*   CBG: No results for input(s): GLUCAP in the last 168 hours.  Recent Results (from the past 240 hour(s))  Culture, blood (routine x 2)     Status: None   Collection Time: 03/21/14  3:30 AM  Result Value Ref Range Status   Specimen Description BLOOD RIGHT ANTECUBITAL  Final   Special Requests BOTTLES DRAWN AEROBIC AND ANAEROBIC 10CC EACH  Final   Culture NO GROWTH 5 DAYS  Final   Report Status 03/26/2014 FINAL  Final  Culture, blood (routine x 2)     Status: None   Collection Time: 03/21/14  3:35 AM  Result Value Ref Range Status   Specimen Description BLOOD LEFT ANTECUBITAL  Final   Special Requests   Final    BOTTLES DRAWN AEROBIC AND ANAEROBIC AEB=4CC ANA=2CC   Culture NO GROWTH 5 DAYS  Final   Report Status 03/26/2014 FINAL  Final  MRSA PCR Screening     Status: None   Collection Time: 03/22/14 11:33 AM  Result Value Ref Range Status   MRSA by PCR NEGATIVE NEGATIVE Final    Comment:        The GeneXpert MRSA Assay (FDA approved for NASAL specimens only), is one component of a comprehensive MRSA colonization surveillance program. It is not intended to diagnose MRSA infection nor to guide or monitor treatment for MRSA infections.      Studies: No results found.  Scheduled Meds: . antiseptic oral rinse  7 mL Mouth Rinse BID  . aspirin  81 mg Oral Daily  . atorvastatin  20 mg Oral q1800  . furosemide  60 mg Intravenous BID  . guaiFENesin  600 mg Oral BID  . heparin  5,000 Units Subcutaneous 3 times per day  . hydrALAZINE  50 mg Oral 3 times per day  . ipratropium-albuterol  3 mL Nebulization QID  . loratadine  10 mg Oral Daily  . pantoprazole  40 mg Oral Daily  . piperacillin-tazobactam (ZOSYN)  IV  2.25 g Intravenous Q8H  . sertraline  50 mg Oral Daily  . sodium chloride  3 mL Intravenous Q12H  . sodium chloride  3 mL Intravenous Q12H    Continuous Infusions: . 0.45 % NaCl with KCl 20 mEq / L 125 mL/hr at 03/26/14 0240    Active Problems:   Acute respiratory failure with hypoxia   AKI (acute kidney injury)   CKD (chronic kidney disease), stage III   CAP (community acquired pneumonia)   Anemia   Leukocytosis   Aspiration pneumonia   Hypokalemia   Constipation    Time spent: 25 min    [facility] Pager [phone] If 7PM-7AM, please contact night-coverage at [email or web address], password [facility] 03/26/2014, 2:14 PM  LOS: 5 days

## 2014-03-26 NOTE — Consult Note (Signed)
CARDIOLOGY CONSULT NOTE   Patient ID: Corey Cain MRN: [id number] DOB/AGE: [birth year] 78 y.o.  Admit Date: 03/21/2014 Referring Physician: PTH Primary Physician: No primary care provider on file. Consulting Cardiologist: Kate Sable MD Primary Cardiologist:  New Reason for Consultation: Acute Diastolic CHF  Clinical Summary Corey Cain is a 80 y.o.male with known history of hypertension, admitted with hypoxia, ,pneumonia, now with AKI seen by Corey Cain. Hx is obtained from current and past medical records. There does not appear to be a cardiac hx per limited notes. No family at bedside and patient has mild dementia,  Records reveal that he has had coughing and congestion for several days prior to admission with some mild LEE.Marland Kitchen He was brought to ER by his son and was hypoxic, placed on O2.   In ER labs demonstrated elevated Creatinine at 2.56, BUN 46, albumin 2.9, Elevated LFTS with GFR of 23.  He was not found to be anemic, but had leukocytosis, WBC's 14.1 Pro-BNP 2695. Initial CXR demonstrated small bilateral pleural effusions, but follow up CXR on 03/22/2014 demonstrated right lung pneumonia concerning for aspiration and CHF. Troponin negative X 4. Renal ultrasound demonstrated medical renal disease and a left sided renal cyst.   He has subsequently been started on IV antibiotics and remains on O2 support with [place]. He has been also started on IV lasix. We are asked to assist with CHF management. Corey Cain has started him on IV fluids with potassium replacment in the setting of hypokalemia (3.4) with improvement in labs concerning creatinine and potassium status.   Echocardiogram demonstrated normal LV fx, with Grade I diastolic dysfunction with moderately elevated pulmonary pressure. He initially diuresed 1.2 liters, but with IV hydration, he is negative 300 cc this am.    No Known Allergies  Medications Scheduled Medications: . antiseptic oral rinse  7 mL Mouth Rinse  BID  . aspirin  81 mg Oral Daily  . atorvastatin  20 mg Oral q1800  . docusate sodium  100 mg Oral BID  . furosemide  60 mg Intravenous BID  . guaiFENesin  600 mg Oral BID  . heparin  5,000 Units Subcutaneous 3 times per day  . hydrALAZINE  25 mg Oral 3 times per day  . ipratropium-albuterol  3 mL Nebulization Q4H  . loratadine  10 mg Oral Daily  . pantoprazole  40 mg Oral Daily  . piperacillin-tazobactam (ZOSYN)  IV  2.25 g Intravenous Q8H  . predniSONE  40 mg Oral QAC breakfast  . sertraline  50 mg Oral Daily  . sodium chloride  3 mL Intravenous Q12H  . sodium chloride  3 mL Intravenous Q12H    Infusions: . 0.45 % NaCl with KCl 20 mEq / L 125 mL/hr at 03/26/14 0252    PRN Medications: sodium chloride, acetaminophen, sodium chloride   Past Medical History  Diagnosis Date  . Cancer   . Colon cancer approx 2005    colon resection with resolution  . AKI (acute kidney injury) 03/21/2014  . Hypertension   . Hypercholesterolemia     Past Surgical History  Procedure Laterality Date  . Colectomy  2005    Due to colon cancer    Family Hx: Unable to obtain, as patient does not remember family hx on interview  Social History Corey Cain reports that he has never smoked. He does not have any smokeless tobacco history on file. Corey Cain reports that he does not drink alcohol.  Review of Systems Otherwise reviewed and  negative except as outlined.  Physical Examination Blood pressure 155/62, pulse 97, temperature 98.4 F (36.9 C), temperature source Axillary, resp. rate 21, height 5\' 6"  (1.676 m), weight 173 lb 8 oz (78.7 kg), SpO2 95 %.  Intake/Output Summary (Last 24 hours) at 03/26/14 0911 Last data filed at 03/26/14 0252  Gross per 24 hour  Intake 2472.5 ml  Output   1200 ml  Net 1272.5 ml    Telemetry:   GEN: Mild confusion, but is alert. Frequent coughing HEENT: Conjunctiva and lids normal, oropharynx clear with moist mucosa. Neck: Supple, no elevated JVP  or carotid bruits, no thyromegaly. Lungs: Bilateral rales and rhonchi, mild expiratory wheezes in the left base. Frequent congested coughing. Cardiac: Regular rate and rhythm, slightly tachycardic 1/6 systolic murmur, no pericardial rub. Abdomen: Soft, nontender, no hepatomegaly, bowel sounds present, no guarding or rebound. Extremities: No pitting edema, distal pulses 2+. Skin: Warm and dry. Musculoskeletal: No kyphosis. Neuropsychiatric: Alert and mildly confused, but cooperative.   Prior Cardiac Testing/Procedures 1.Echocardiogram 03/21/2014 Left ventricle: The cavity size was normal. There was mild focal basal hypertrophy of the septum. Systolic function was normal. The estimated ejection fraction was in the range of 60% to 65%. Wall motion was normal; there were no regional wall motion abnormalities. Doppler parameters are consistent with abnormal left ventricular relaxation (grade 1 diastolic dysfunction). - Aortic valve: Mildly to moderately calcified annulus. Trileaflet; mildly calcified leaflets. Moderate calcification involving the noncoronary cusp. There was trivial regurgitation. - Mitral valve: Calcified annulus. There was trivial regurgitation. - Tricuspid valve: Peak RV-RA gradient (S): 44 mm Hg. - Inferior vena cava: Poorly visualized. Unable to estimate CVP. - Pericardium, extracardiac: There was no pericardial effusion.  Impressions:  - Mild basal septal hypertrophy with LVEF 95-28%, grade 1 diastolic dysfunction. MAC with trivial mitral regurgitation. Moderately sclerotic aortic valve without stenosis, trivial aortic regurgitation noted. Mild tricuspid regurgitation. RV-RA gradient 44 mm mercury, suggests moderately elevated pulmonary artery systolic pressure.   Lab Results  Basic Metabolic Panel:  Recent Labs Lab 03/22/14 0521 03/23/14 0500 03/24/14 0517 03/25/14 0521 03/26/14 0633 03/26/14 0635  NA 143 142 141 144  --  141  K 4.4 4.3 3.8 3.4*  --   4.0  CL 102 100 100 99  --  96  CO2 26 26 26 30   --  27  GLUCOSE 105* 144* 91 116*  --  114*  BUN 49* 62* 77* 81*  --  83*  CREATININE 2.48* 2.63* 2.95* 3.09*  --  2.97*  CALCIUM 8.7 8.5 7.7* 7.7*  --  7.6*  PHOS  --   --   --   --  4.2  --     Liver Function Tests:  Recent Labs Lab 03/21/14 0500 03/22/14 0521  AST 45* 36  ALT 53 43  ALKPHOS 237* 218*  BILITOT 0.2* 0.2*  PROT 7.2 6.9  ALBUMIN 2.9* 2.7*    CBC:  Recent Labs Lab 03/22/14 0521 03/23/14 0500 03/24/14 0517 03/25/14 0521 03/26/14 0635  WBC 12.1* 11.9* 17.3* 16.1* 15.9*  NEUTROABS 10.6* 11.4* 15.3* 14.4* 14.3*  HGB 10.7* 10.6* 10.3* 10.7* 11.0*  HCT 34.1* 34.3* 31.6* 34.1* 34.4*  MCV 79.5 79.6 78.4 78.4 77.0*  PLT 313 335 331 338 318    Cardiac Enzymes:  Recent Labs Lab 03/21/14 0228 03/21/14 0500 03/21/14 0936 03/21/14 1548  TROPONINI <0.30 <0.30 <0.30 <0.30     Radiology: No results found.   UXL:KGMWN tachycardia with RBBB. Rate of 105.    Impression  and Recommendations  1. Acute Diastolic CHF: No evidence of LEE or abdominal distention. Echo shows grade 1 diastolic dysfunction, but normal EF. He is on IV lasix 60 mg BID, and has diuresed, but is now receiving IV fluids in the setting of AKI. HR is still sightly elevated in the setting of infective process with pneumonia. O2 Sat is improving. May be able to decrease lasix to 40 mg IV this am. Consider low dose BB for better HR control.   2, Hx of hypertension: BP is slightly elevated. He is on hydralazine 25 mg TID presently.   3. Right Lobe Pneumonia: On abx treatments with neb tx. Lungs still very congested.   4. AKI: Per notes, has had elevation in Creatinine prior to being admitted. Cannot confirm as no family in the room at present.   4. Hypercholesterolemia: On statin. Admission LFTS were elevated but are trending down. .     SignedPhill Myron. Tidus Upchurch NP  03/26/2014, 9:11 AM Co-Sign MD

## 2014-03-26 NOTE — Progress Notes (Signed)
An attempt was done to see pt this afternoon but he was sleeping soundly.  His daughter stated that he had just been able to relax enough to rest--has had no sleep during the past 36 hours.  We opted to let him rest and will try to see him tomorrow.

## 2014-03-26 NOTE — Progress Notes (Signed)
Corey Cain  MRN: [id number]  DOB/AGE: 14-Oct-1914 80 y.o.  Primary Care Physician:No primary care provider on file.  Admit date: 03/21/2014  Chief Complaint:  Chief Complaint  Patient presents with  . Wheezing    S-Pt presented on  03/21/2014 with 80 y.o.  Chief Complaint  Patient presents with  . Wheezing  .    Pt does not offers any complaints.    Pt son in the room    Pt son also does not voice any new concerns.   Meds . antiseptic oral rinse  7 mL Mouth Rinse BID  . aspirin  81 mg Oral Daily  . atorvastatin  20 mg Oral q1800  . docusate sodium  100 mg Oral BID  . furosemide  60 mg Intravenous BID  . guaiFENesin  600 mg Oral BID  . heparin  5,000 Units Subcutaneous 3 times per day  . hydrALAZINE  25 mg Oral 3 times per day  . ipratropium-albuterol  3 mL Nebulization Q4H  . loratadine  10 mg Oral Daily  . pantoprazole  40 mg Oral Daily  . piperacillin-tazobactam (ZOSYN)  IV  2.25 g Intravenous Q8H  . predniSONE  40 mg Oral QAC breakfast  . sertraline  50 mg Oral Daily  . sodium chloride  3 mL Intravenous Q12H  . sodium chloride  3 mL Intravenous Q12H       Physical Exam: Vital signs in last 24 hours: Temp:  [97.6 F (36.4 C)-98.8 F (37.1 C)] 98.4 F (36.9 C) (11/17 0500) Pulse Rate:  [92-108] 97 (11/17 0500) Resp:  [20-30] 21 (11/17 0500) BP: (150-185)/(52-123) 155/62 mmHg (11/17 0500) SpO2:  [95 %-100 %] 95 % (11/17 0730) Weight:  [173 lb 8 oz (78.7 kg)] 173 lb 8 oz (78.7 kg) (11/17 0300) Weight change: 1 lb 15.7 oz (0.9 kg) Last BM Date: 03/25/14  Intake/Output from previous day: 11/16 0701 - 11/17 0700 In: 2522.5 [P.O.:320; I.V.:2052.5; IV Piggyback:150] Out: 1500 [Urine:1500]     Physical Exam: General- pt is lethargic. Resp- No acute REsp distress, Rhonchi + CVS- S1S2 regular in rate and rhythm GIT- BS+, soft, NT, ND EXT- NO LE Edema,NO Cyanosis   Lab Results: CBC  Recent Labs  03/25/14 0521 03/26/14 0635  WBC 16.1* 15.9*  HGB  10.7* 11.0*  HCT 34.1* 34.4*  PLT 338 318    BMET  Recent Labs  03/25/14 0521 03/26/14 0635  NA 144 141  K 3.4* 4.0  CL 99 96  CO2 30 27  GLUCOSE 116* 114*  BUN 81* 83*  CREATININE 3.09* 2.97*  CALCIUM 7.7* 7.6*    Trend Creat 2015 2.56=>2.95=>3.09=>2.97  MICRO Recent Results (from the past 240 hour(s))  Culture, blood (routine x 2)     Status: None (Preliminary result)   Collection Time: 03/21/14  3:30 AM  Result Value Ref Range Status   Specimen Description BLOOD RIGHT ANTECUBITAL  Final   Special Requests BOTTLES DRAWN AEROBIC AND ANAEROBIC 10CC EACH  Final   Culture NO GROWTH 4 DAYS  Final   Report Status PENDING  Incomplete  Culture, blood (routine x 2)     Status: None (Preliminary result)   Collection Time: 03/21/14  3:35 AM  Result Value Ref Range Status   Specimen Description BLOOD LEFT ANTECUBITAL  Final   Special Requests   Final    BOTTLES DRAWN AEROBIC AND ANAEROBIC AEB=4CC ANA=2CC   Culture NO GROWTH 4 DAYS  Final   Report Status PENDING  Incomplete  MRSA PCR Screening  Status: None   Collection Time: 03/22/14 11:33 AM  Result Value Ref Range Status   MRSA by PCR NEGATIVE NEGATIVE Final    Comment:        The GeneXpert MRSA Assay (FDA approved for NASAL specimens only), is one component of a comprehensive MRSA colonization surveillance program. It is not intended to diagnose MRSA infection nor to guide or monitor treatment for MRSA infections.       Lab Results  Component Value Date   CALCIUM 7.6* 03/26/2014   PHOS 4.2 03/26/2014   Alb 2.7   Impression: 1)Renal  AKI secondary to ATN                AKI on CKD                AKi at plateau               CKD stage 3 .                 CKD secondary to HTN/Age associated decline                Progression of CKD marked with AKI                 2)HTN  Medication- On Diuretics- On Vasodilators  3)Anemia HGb at goal (9--11)   4)CKD Mineral-Bone Disorder  Phosphorus at  goal. Calcium ( when corrected for low albumin )at goal.  5)Resp-Admitted with  Acute resp failure Primary MD following  6)Electrolytes  Hypokalemic- now better    repleted NOrmonatremic   7)Acid base Co2 at goal     Plan:  Will continue current care      Renn Stille S 03/26/2014, 9:14 AM

## 2014-03-26 NOTE — Procedures (Signed)
Objective Swallowing Evaluation: Modified Barium Swallowing Study  Patient Details  Name: Corey Cain MRN: [id number] Date of Birth: Nov 10, 1914  Today's Date: 03/26/2014 Time: 0814-4818 SLP Time Calculation (min) (ACUTE ONLY): 26 min  Past Medical History:  Past Medical History  Diagnosis Date  . Cancer   . Colon cancer approx 2005    colon resection with resolution  . AKI (acute kidney injury) 03/21/2014  . Hypertension   . Hypercholesterolemia    Past Surgical History:  Past Surgical History  Procedure Laterality Date  . Colectomy  2005    Due to colon cancer   HPI:  Mr. Corey Cain  is a 80 y.o. year old male presenting with Acute resp failure w/ hypoxia secondary to pneumonia and acute diastolic CHF. He was admitted to telemetry and starte don IV antibiotics empirically for pneumonia. Cultures are pending. Echocardiogram done and on strict intake and output. Cardiac enzymes are negative and EKG does not show any ischemic changes. Echocardiogram shows grade 1 diastolic dysfunction and elevated pulmonary artery pressures. Continue nasal canula oxygen to keep sats>90%. Urine strep negative. ABg looks okay. Repeat CXR shows increasing edema and congestive heart failure with increasing density in the right upper lobe concerning for aspiration pneumonia. He is on IV lasix for diuresis and antibiotics changed to IV zosyn. Mr. Corey Cain son present in room for clinical swallow evaluation and reports that his father has "a great appetite and no difficulty swallowing". He consumes all meals at home upright in a chair and masticates his medications if they are larger per son.     Chest X-ray showing some improvement: IMPRESSION: 1. Right lower lung zone opacity noted previously is less apparent consistent with improvement. No new abnormalities.  Assessment / Plan / Recommendation Clinical Impression  Dysphagia Diagnosis: Mild oral phase dysphagia;Mild pharyngeal phase  dysphagia;Suspected primary esophageal dysphagia Clinical impression: Mr. Corey Cain was much less alert today than when seen yesterday in room for clinical swallow evaluation. Son reports poor sleep since admittance to hospital. Pt presents with mild oropharyngeal phase dysphagia characterized by reduced lingual movement and bolus cohesiveness, piecemeal deglutition, reduced tongue base retraction, mildly decreased hyolaryngeal excursion resulting in moderate vallecular residue most pronounced with puree textures (clearance improves with repeat/dry swallows) and flash penetration of thin liquids during the swallow without witnessed aspiration. Pt did clear throat several times during the study. Suspect weakness and current lethargy negatively impact oropharyngeal swallow at this time. Recommend down grading textures to mechanical soft and continuation of thin liquids. Slow rate with cues to repeat/dry swallow throughout meals. Pt at risk for prandial and post prandial aspiration due to significantly decreased esophageal motility with delayed emptying of esophagus.     Treatment Recommendation  Therapy as outlined in treatment plan below    Diet Recommendation Dysphagia 3 (Mechanical Soft);Thin liquid   Liquid Administration via: Cup;Straw Medication Administration: Whole meds with liquid Supervision: Patient able to self feed;Intermittent supervision to cue for compensatory strategies Compensations: Slow rate;Multiple dry swallows after each bite/sip Postural Changes and/or Swallow Maneuvers: Seated upright 90 degrees;Upright 30-60 min after meal;Out of bed for meals    Other  Recommendations Other Recommendations: Clarify dietary restrictions   Follow Up Recommendations  Skilled Nursing facility    Frequency and Duration min 2x/week  1 week   Pertinent Vitals/Pain VSS    SLP Swallow Goals   Pt will demonstrate safe and efficient consumption of least restrictive diet with use of strategies as  needed.   General Date of Onset: 03/21/14  HPI: Mr. Corey Cain  is a 80 y.o. year old male presenting with Acute resp failure w/ hypoxia secondary to pneumonia and acute diastolic CHF. He was admitted to telemetry and starte don IV antibiotics empirically for pneumonia. Cultures are pending. Echocardiogram done and on strict intake and output. Cardiac enzymes are negative and EKG does not show any ischemic changes. Echocardiogram shows grade 1 diastolic dysfunction and elevated pulmonary artery pressures. Continue nasal canula oxygen to keep sats>90%. Urine strep negative. ABg looks okay. Repeat CXR shows increasing edema and congestive heart failure with increasing density in the right upper lobe concerning for aspiration pneumonia. He is on IV lasix for diuresis and antibiotics changed to IV zosyn. Mr. Corey Cain son present in room for clinical swallow evaluation and reports that his father has "a great appetite and no difficulty swallowing". He consumes all meals at home upright in a chair and masticates his medications if they are larger per son.  Type of Study: Modified Barium Swallowing Study Reason for Referral: Objectively evaluate swallowing function Previous Swallow Assessment: None on record; son reports h/o esophageal dilation in the past Diet Prior to this Study: Regular;Thin liquids Temperature Spikes Noted: No Respiratory Status: Nasal cannula History of Recent Intubation: No Behavior/Cognition: Cooperative;Pleasant mood;Lethargic Oral Cavity - Dentition: Adequate natural dentition Oral Motor / Sensory Function: Within functional limits Self-Feeding Abilities: Needs set up Patient Positioning: Upright in chair Baseline Vocal Quality: Low vocal intensity;Clear (can hear congestion) Volitional Cough: Congested Volitional Swallow: Able to elicit Anatomy: Within functional limits (Pt leaning heavily to right during exam) Pharyngeal Secretions: Not observed secondary MBS     Reason for Referral Objectively evaluate swallowing function   Oral Phase Oral Preparation/Oral Phase Oral Phase: Impaired Oral - Solids Oral - Puree: Delayed oral transit;Lingual/palatal residue;Piecemeal swallowing;Reduced posterior propulsion Oral - Regular: Reduced posterior propulsion;Lingual/palatal residue;Piecemeal swallowing;Delayed oral transit Oral - Pill:  (Pt masticated pill despite request to swallow whole) Oral Phase - Comment Oral Phase - Comment: Mild slowness with oral prep, weak AP transit with decreased bolus cohesion   Pharyngeal Phase Pharyngeal Phase Pharyngeal Phase: Impaired Pharyngeal - Thin Pharyngeal - Thin Cup: Delayed swallow initiation;Premature spillage to valleculae;Pharyngeal residue - valleculae;Penetration/Aspiration during swallow;Reduced anterior laryngeal mobility;Reduced laryngeal elevation Penetration/Aspiration details (thin cup): Material does not enter airway;Material enters airway, remains ABOVE vocal cords then ejected out Pharyngeal - Thin Straw: Delayed swallow initiation;Premature spillage to valleculae;Premature spillage to pyriform sinuses;Reduced anterior laryngeal mobility;Reduced laryngeal elevation;Penetration/Aspiration during swallow;Pharyngeal residue - valleculae Penetration/Aspiration details (thin straw): Material does not enter airway;Material enters airway, remains ABOVE vocal cords then ejected out Pharyngeal - Solids Pharyngeal - Puree: Delayed swallow initiation;Premature spillage to valleculae;Reduced anterior laryngeal mobility;Reduced laryngeal elevation;Pharyngeal residue - valleculae;Reduced tongue base retraction Pharyngeal - Regular: Delayed swallow initiation;Premature spillage to valleculae;Reduced anterior laryngeal mobility;Reduced tongue base retraction;Reduced laryngeal elevation;Pharyngeal residue - valleculae Pharyngeal - Pill:  (pt masticated pill)  Cervical Esophageal Phase       Thank you,  Genene Churn, CCC-SLP [phone]  Cervical Esophageal Phase Cervical Esophageal Phase: Impaired Cervical Esophageal Phase - Comment Cervical Esophageal Comment: delayed emptying of esophagus; barium retained throughout the study despite liquid wash; reduced motility         Corey Cain 03/26/2014, 7:31 PM

## 2014-03-26 NOTE — Plan of Care (Signed)
Problem: Phase I Progression Outcomes Goal: Dyspnea controlled at rest (HF) Outcome: Progressing

## 2014-03-26 NOTE — H&P (Deleted)
Scheduled Meds: . antiseptic oral rinse  7 mL Mouth Rinse BID  . aspirin  81 mg Oral Daily  . atorvastatin  20 mg Oral q1800  . furosemide  60 mg Intravenous BID  . guaiFENesin  600 mg Oral BID  . heparin  5,000 Units Subcutaneous 3 times per day  . hydrALAZINE  25 mg Oral 3 times per day  . ipratropium-albuterol  3 mL Nebulization Q4H  . loratadine  10 mg Oral Daily  . pantoprazole  40 mg Oral Daily  . piperacillin-tazobactam (ZOSYN)  IV  2.25 g Intravenous Q8H  . sertraline  50 mg Oral Daily  . sodium chloride  3 mL Intravenous Q12H  . sodium chloride  3 mL Intravenous Q12H   Continuous Infusions: . 0.45 % NaCl with KCl 20 mEq / L 125 mL/hr at 03/26/14 0252   PRN Meds:.sodium chloride, acetaminophen, sodium chloride

## 2014-03-26 NOTE — Clinical Social Work Note (Signed)
CSW provided bed offers and pt's son Darnell Level accepts [facility]. Facility notified and will initiate [employer] authorization. CSW will continue to follow.  Benay Pike, [place]

## 2014-03-26 NOTE — Clinical Social Work Placement (Signed)
Clinical Social Work Department CLINICAL SOCIAL WORK PLACEMENT NOTE 03/26/2014  Patient:  Corey Cain, Corey Cain  Account Number:  [id number] Admit date:  03/21/2014  Clinical Social Worker:  Ambrose Pancoast, LCSW  Date/time:  03/26/2014 11:40 AM  Clinical Social Work is seeking post-discharge placement for this patient at the following level of care:   [place]   (*CSW will update this form in Epic as items are completed)   03/26/2014  Patient/family provided with [place] Department of Clinical Social Work's list of facilities offering this level of care within the geographic area requested by the patient (or if unable, by the patient's family).  03/26/2014  Patient/family informed of their freedom to choose among providers that offer the needed level of care, that participate in Medicare, Medicaid or managed care program needed by the patient, have an available bed and are willing to accept the patient.  03/26/2014  Patient/family informed of [facility]' ownership interest in [facility], as well as of the fact that they are under no obligation to receive care at this facility.  PASARR submitted to EDS on 03/26/2014 PASARR number received on 03/26/2014  FL2 transmitted to all facilities in geographic area requested by pt/family on  03/26/2014 FL2 transmitted to all facilities within larger geographic area on   Patient informed that his/her managed care company has contracts with or will negotiate with  certain facilities, including the following:     Patient/family informed of bed offers received:   Patient chooses bed at  Physician recommends and patient chooses bed at    Patient to be transferred to  on   Patient to be transferred to facility by  Patient and family notified of transfer on  Name of family member notified:    The following physician request were entered in Epic:   Additional Comments: CSW advised patient's son that patient's [facility]  authorization can take up to 24 hours.  Patient's son gave CSW permission to fax clinicals to both [facility] and [facility].  Ambrose Pancoast, [place]

## 2014-03-26 NOTE — Plan of Care (Signed)
Problem: Phase I Progression Outcomes Goal: Voiding-avoid urinary catheter unless indicated Outcome: Progressing Patient using the urinal

## 2014-03-26 NOTE — Progress Notes (Signed)
ANTIBIOTIC CONSULT NOTE  Pharmacy Consult for Vancomycin & Zosyn Indication: pneumonia  No Known Allergies  Patient Measurements: Height: 5\' 6"  (167.6 cm) Weight: 173 lb 8 oz (78.7 kg) IBW/kg (Calculated) : 63.8  Vital Signs: Temp: 98.4 F (36.9 C) (11/17 0500) Temp Source: Axillary (11/17 0500) BP: 180/66 mmHg (11/17 1314) Pulse Rate: 93 (11/17 1125) Intake/Output from previous day: 11/16 0701 - 11/17 0700 In: 2522.5 [P.O.:320; I.V.:2052.5; IV Piggyback:150] Out: 1500 [Urine:1500] Intake/Output from this shift: Total I/O In: 240 [P.O.:240] Out: 145 [Urine:145]  Labs:  Recent Labs  03/24/14 0517 03/24/14 1114 03/25/14 0521 03/26/14 0635  WBC 17.3*  --  16.1* 15.9*  HGB 10.3*  --  10.7* 11.0*  PLT 331  --  338 318  LABCREA  --  73.67  --   --   CREATININE 2.95*  --  3.09* 2.97*   Estimated Creatinine Clearance: 14 mL/min (by C-G formula based on Cr of 2.97). No results for input(s): VANCOTROUGH, VANCOPEAK, VANCORANDOM, GENTTROUGH, GENTPEAK, GENTRANDOM, TOBRATROUGH, TOBRAPEAK, TOBRARND, AMIKACINPEAK, AMIKACINTROU, AMIKACIN in the last 72 hours.   Microbiology: Recent Results (from the past 720 hour(s))  Culture, blood (routine x 2)     Status: None   Collection Time: 03/21/14  3:30 AM  Result Value Ref Range Status   Specimen Description BLOOD RIGHT ANTECUBITAL  Final   Special Requests BOTTLES DRAWN AEROBIC AND ANAEROBIC 10CC EACH  Final   Culture NO GROWTH 5 DAYS  Final   Report Status 03/26/2014 FINAL  Final  Culture, blood (routine x 2)     Status: None   Collection Time: 03/21/14  3:35 AM  Result Value Ref Range Status   Specimen Description BLOOD LEFT ANTECUBITAL  Final   Special Requests   Final    BOTTLES DRAWN AEROBIC AND ANAEROBIC AEB=4CC ANA=2CC   Culture NO GROWTH 5 DAYS  Final   Report Status 03/26/2014 FINAL  Final  MRSA PCR Screening     Status: None   Collection Time: 03/22/14 11:33 AM  Result Value Ref Range Status   MRSA by PCR NEGATIVE  NEGATIVE Final    Comment:        The GeneXpert MRSA Assay (FDA approved for NASAL specimens only), is one component of a comprehensive MRSA colonization surveillance program. It is not intended to diagnose MRSA infection nor to guide or monitor treatment for MRSA infections.     Anti-infectives    Start     Dose/Rate Route Frequency Ordered Stop   03/24/14 1000  azithromycin (ZITHROMAX) tablet 500 mg  Status:  Discontinued     500 mg Oral Daily 03/23/14 1124 03/23/14 1415   03/23/14 1600  piperacillin-tazobactam (ZOSYN) IVPB 2.25 g     2.25 g100 mL/hr over 30 Minutes Intravenous Every 8 hours 03/23/14 1437     03/23/14 1600  vancomycin (VANCOCIN) IVPB 1000 mg/200 mL premix  Status:  Discontinued     1,000 mg200 mL/hr over 60 Minutes Intravenous Every 48 hours 03/23/14 1437 03/24/14 0947   03/22/14 0800  azithromycin (ZITHROMAX) 500 mg in dextrose 5 % 250 mL IVPB  Status:  Discontinued     500 mg250 mL/hr over 60 Minutes Intravenous Every 24 hours 03/21/14 0517 03/23/14 1124   03/21/14 0415  cefTRIAXone (ROCEPHIN) 1 g in dextrose 5 % 50 mL IVPB  Status:  Discontinued     1 g100 mL/hr over 30 Minutes Intravenous Daily 03/21/14 0402 03/23/14 1415   03/21/14 0400  cefTRIAXone (ROCEPHIN) 1 g in dextrose 5 %  50 mL IVPB  Status:  Discontinued     1 g100 mL/hr over 30 Minutes Intravenous Every 24 hours 03/21/14 0356 03/21/14 0400   03/21/14 0400  azithromycin (ZITHROMAX) 500 mg in dextrose 5 % 250 mL IVPB  Status:  Discontinued     500 mg250 mL/hr over 60 Minutes Intravenous Every 24 hours 03/21/14 0356 03/21/14 0518   03/21/14 0345  cefTRIAXone (ROCEPHIN) 1 g in dextrose 5 % 50 mL IVPB  Status:  Discontinued     1 g100 mL/hr over 30 Minutes Intravenous  Once 03/21/14 0330 03/21/14 0400   03/21/14 0345  azithromycin (ZITHROMAX) 500 mg in dextrose 5 % 250 mL IVPB  Status:  Discontinued     500 mg250 mL/hr over 60 Minutes Intravenous Every 24 hours 03/21/14 0330 03/21/14 0356      Assessment: 89 yoM admitted with acute respiratory failure with hypoxia secondary to PNA vs HF on 03/21/14.  He has been on Rocephin & Zithromax since admission, however CXR reveals worsening density in RUL concerning for aspiration PNA.  Antibiotic coverage being broadened.   He remains afebrile.  WBC mildly elevated, but trending down since admission.   Acute on chronic renal insufficiency noted.   Vancomycin 11/14>>11/15 Zosyn 11/14>> Rocephin 11/12>>11/14 Zithromax 11/12>>11/14  Goal of Therapy:  Eradicate infection.  Plan:  Zosyn 2.25gm IV q8h Monitor renal function and cx data   Hart Robinsons A 03/26/2014,1:48 PM

## 2014-03-27 ENCOUNTER — Inpatient Hospital Stay (HOSPITAL_COMMUNITY): Payer: Medicare FFS

## 2014-03-27 DIAGNOSIS — G8190 Hemiplegia, unspecified affecting unspecified side: Secondary | ICD-10-CM

## 2014-03-27 LAB — BASIC METABOLIC PANEL
ANION GAP: 18 — AB (ref 5–15)
BUN: 78 mg/dL — ABNORMAL HIGH (ref 6–23)
CALCIUM: 7.3 mg/dL — AB (ref 8.4–10.5)
CO2: 26 mEq/L (ref 19–32)
CREATININE: 2.76 mg/dL — AB (ref 0.50–1.35)
Chloride: 95 mEq/L — ABNORMAL LOW (ref 96–112)
GFR calc non Af Amer: 18 mL/min — ABNORMAL LOW (ref >90)
GFR, EST AFRICAN AMERICAN: 21 mL/min — AB (ref >90)
Glucose, Bld: 98 mg/dL (ref 70–99)
Potassium: 4.1 mEq/L (ref 3.7–5.3)
Sodium: 139 mEq/L (ref 137–147)

## 2014-03-27 LAB — CBC WITH DIFFERENTIAL/PLATELET
BASOS PCT: 0 % (ref 0–1)
Basophils Absolute: 0 10*3/uL (ref 0.0–0.1)
EOS PCT: 0 % (ref 0–5)
Eosinophils Absolute: 0 10*3/uL (ref 0.0–0.7)
HEMATOCRIT: 35.8 % — AB (ref 39.0–52.0)
Hemoglobin: 11.9 g/dL — ABNORMAL LOW (ref 13.0–17.0)
Lymphocytes Relative: 4 % — ABNORMAL LOW (ref 12–46)
Lymphs Abs: 0.8 10*3/uL (ref 0.7–4.0)
MCH: 25.2 pg — ABNORMAL LOW (ref 26.0–34.0)
MCHC: 33.2 g/dL (ref 30.0–36.0)
MCV: 75.8 fL — AB (ref 78.0–100.0)
MONO ABS: 1 10*3/uL (ref 0.1–1.0)
Monocytes Relative: 5 % (ref 3–12)
Neutro Abs: 17.7 10*3/uL — ABNORMAL HIGH (ref 1.7–7.7)
Neutrophils Relative %: 91 % — ABNORMAL HIGH (ref 43–77)
Platelets: 376 10*3/uL (ref 150–400)
RBC: 4.72 MIL/uL (ref 4.22–5.81)
RDW: 16.8 % — ABNORMAL HIGH (ref 11.5–15.5)
WBC: 19.6 10*3/uL — ABNORMAL HIGH (ref 4.0–10.5)

## 2014-03-27 MED ORDER — ASPIRIN 325 MG PO TABS
325.0000 mg | ORAL_TABLET | Freq: Every day | ORAL | Status: DC
  Administered 2014-03-27: 325 mg via ORAL
  Filled 2014-03-27: qty 1

## 2014-03-27 MED ORDER — FUROSEMIDE 10 MG/ML IJ SOLN
40.0000 mg | Freq: Two times a day (BID) | INTRAMUSCULAR | Status: DC
  Administered 2014-03-27 – 2014-03-29 (×5): 40 mg via INTRAVENOUS
  Filled 2014-03-27 (×5): qty 4

## 2014-03-27 MED FILL — Piperacillin Sod-Tazobactam Sod in Dex IV Soln 2-0.25GM/50ML: INTRAVENOUS | Qty: 50 | Status: AC

## 2014-03-27 NOTE — Progress Notes (Signed)
PT Cancellation Note  Patient Details Name: Corey Cain MRN: [id number] DOB: 1914/12/31   Cancelled Treatment:     Pt more lethargic today and unable to participate in PT. MD/RN aware and will continue to monitor pt status.   Lelon Mast 03/27/2014, 9:56 AM

## 2014-03-27 NOTE — Progress Notes (Signed)
OT Cancellation Note  Patient Details Name: Corey Cain MRN: [id number] DOB: [birth year]   Cancelled Treatment:    Reason Eval/Treat Not Completed: Fatigue/lethargy limiting ability to participate;Medical issues which prohibited therapy.  Pt with change in status overnight.  He is more lethargic this AM, less responsive to OTR and family questions, and unable to turn his head to the left.  Pt denies any pain, but resisted attempts for pt to turn his head to the right.  Did not seem aware of deficit.  Pt overall very fatigued and lethargic.  MD notified, and will arrive shortly.  Will re-attempt OT tx after MD visit.  Bea Graff Tyreshia Ingman, MS, OTR/L [facility] [phone] 03/27/2014, 9:47 AM

## 2014-03-27 NOTE — Progress Notes (Signed)
Patient's daughter complains patient not his normal self this morning. Patient unable to do full ROM of neck. Hand grips, left side weak. Patient alert to self, somewhat drowsy, responds to voice and touch. Occupational therapist in room to see patient, states patient is not at his baseline. Patient had episode of choking during breakfast, coughing spells since. Dr. Roderic Palau notified.

## 2014-03-27 NOTE — Progress Notes (Signed)
Corey Cain  MRN: [id number]  DOB/AGE: [birth year] 78 y.o.  Primary Care Physician:No primary care provider on file.  Admit date: 03/21/2014  Chief Complaint:  Chief Complaint  Patient presents with  . Wheezing    S-Pt presented on  03/21/2014 with  Chief Complaint  Patient presents with  . Wheezing  .    Pt does not offers any complaints.    Pt family in the room ( son and daughter).    Pt daughter says she was told 5 yeas ago that there is something wrong with kidney.  Meds . antiseptic oral rinse  7 mL Mouth Rinse BID  . aspirin  325 mg Oral Daily  . atorvastatin  20 mg Oral q1800  . furosemide  60 mg Intravenous BID  . guaiFENesin  600 mg Oral BID  . heparin  5,000 Units Subcutaneous 3 times per day  . hydrALAZINE  50 mg Oral 3 times per day  . ipratropium-albuterol  3 mL Nebulization QID  . loratadine  10 mg Oral Daily  . pantoprazole  40 mg Oral Daily  . piperacillin-tazobactam (ZOSYN)  IV  2.25 g Intravenous Q8H  . sertraline  50 mg Oral Daily  . sodium chloride  3 mL Intravenous Q12H  . sodium chloride  3 mL Intravenous Q12H       Physical Exam: Vital signs in last 24 hours: Temp:  [97.4 F (36.3 C)-98.4 F (36.9 C)] 98.4 F (36.9 C) (11/18 1358) Pulse Rate:  [102-105] 105 (11/18 1358) Resp:  [20-22] 22 (11/18 1358) BP: (118-168)/(54-84) 142/54 mmHg (11/18 1358) SpO2:  [94 %-98 %] 96 % (11/18 1358) Weight:  [172 lb 1.6 oz (78.064 kg)] 172 lb 1.6 oz (78.064 kg) (11/18 0500) Weight change: -1 lb 6.4 oz (-0.636 kg) Last BM Date: 03/27/14  Intake/Output from previous day: 11/17 0701 - 11/18 0700 In: 240 [P.O.:240] Out: 445 [Urine:445] Total I/O In: 300 [P.O.:300] Out: -    Physical Exam: General- pt is lethargic. Resp- No acute REsp distress, Rhonchi + CVS- S1S2 regular in rate and rhythm GIT- BS+, soft, NT, ND EXT- NO LE Edema,NO Cyanosis   Lab Results: CBC  Recent Labs  03/26/14 0635 03/27/14 0543  WBC 15.9* 19.6*  HGB 11.0*  11.9*  HCT 34.4* 35.8*  PLT 318 376    BMET  Recent Labs  03/26/14 0635 03/27/14 0543  NA 141 139  K 4.0 4.1  CL 96 95*  CO2 27 26  GLUCOSE 114* 98  BUN 83* 78*  CREATININE 2.97* 2.76*  CALCIUM 7.6* 7.3*    Trend Creat 2015 2.56=>2.95=>3.09=>2.97=>2.76  MICRO Recent Results (from the past 240 hour(s))  Culture, blood (routine x 2)     Status: None   Collection Time: 03/21/14  3:30 AM  Result Value Ref Range Status   Specimen Description BLOOD RIGHT ANTECUBITAL  Final   Special Requests BOTTLES DRAWN AEROBIC AND ANAEROBIC 10CC EACH  Final   Culture NO GROWTH 5 DAYS  Final   Report Status 03/26/2014 FINAL  Final  Culture, blood (routine x 2)     Status: None   Collection Time: 03/21/14  3:35 AM  Result Value Ref Range Status   Specimen Description BLOOD LEFT ANTECUBITAL  Final   Special Requests   Final    BOTTLES DRAWN AEROBIC AND ANAEROBIC AEB=4CC ANA=2CC   Culture NO GROWTH 5 DAYS  Final   Report Status 03/26/2014 FINAL  Final  MRSA PCR Screening     Status: None  Collection Time: 03/22/14 11:33 AM  Result Value Ref Range Status   MRSA by PCR NEGATIVE NEGATIVE Final    Comment:        The GeneXpert MRSA Assay (FDA approved for NASAL specimens only), is one component of a comprehensive MRSA colonization surveillance program. It is not intended to diagnose MRSA infection nor to guide or monitor treatment for MRSA infections.       Lab Results  Component Value Date   CALCIUM 7.3* 03/27/2014   PHOS 4.2 03/26/2014   Alb 2.7   Impression: 1)Renal  AKI secondary to ATN                AKI on CKD                AKi improving slowly.                CKD stage 3/4 .                 CKD secondary to HTN/Age associated decline.                Progression of CKD marked with AKI                 2)HTN  Medication- On Diuretics- On Vasodilators  3)Anemia HGb at goal (9--11)   4)CKD Mineral-Bone Disorder  Phosphorus at goal. Calcium ( when  corrected for low albumin )at goal.  5)Resp-Admitted with  Acute resp failure Primary MD following  6)Electrolytes  Hypokalemic- now better    repleted NOrmonatremic   7)Acid base Co2 at goal     Plan:  Will reduce IVF rate Will reduce lasix Will follow bmet.     Graeme Menees S 03/27/2014, 3:31 PM

## 2014-03-27 NOTE — Consult Note (Signed)
Corey A. Merlene Laughter, MD     [email or web address]          Corey Cain is an 80 y.o. male.   ASSESSMENT/PLAN: Acute encephalopathy due to intracranial hemorrhage. 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 Given the extensive nature of the hemorrhage, believe the patient's prognosis is poor. It is unlikely that the patient will survive this hemorrhage. This is discussed with the family. Comfort care measures are recommended.     Symmetric tremors likely due to acute medical illness/anxiety/medication effect.  The patient denies 80 year old man who is admitted to the hospital for hypoxia and the dyspnea. It appears that he has been diagnosed with pneumonia. The history is obtained from the chart and also from the patient's son. The patient became acutely confused about 2 days ago. His son believes that he became rather acutely confused about 2:00 on Monday. However, the patient was noted to be more unresponsive and noted to have left-sided weakness this morning. The son reports that he is actually quite functional at baseline. He is able to do all his ADLs but does require some assistance with walking. He has a to using a walking device. He does have some moderate memory impairment over the years has gotten worse recently. Patient again has become more confused with her imaging obtained shortened intracranial hemorrhage and hence the consultation. He was on aspirin 81 mg. This has been increased with subsequent discontinued given the hemorrhage. Apparently the patient has been able to eat today. The may appear to be somewhat fluctuating level of consciousness. Review of systems impossible given the encephalopathy.  GENERAL: In some discomfort but no acute distress.  HEENT: Supple. Atraumatic normocephalic.   ABDOMEN: soft  EXTREMITIES: No edema   BACK: Normal.  SKIN: Normal by inspection.    MENTAL STATUS: The patient is awake and alert. He  does response to verbal commands and attempts to follow commands. Speech is mostly incomprehensible.   CRANIAL NERVES: Pupils are equal, round and reactive to light; he does vigorously resists eye opening; extra ocular movements seems are full - , there is no significant nystagmus; there does appears to be left-sided neglect. Facial muscle strength is symmetric.  MOTOR:  Antigravity muscle strength are appreciated in the upper extremities.  COORDINATION:  There is constant to moderate amplitude moderate frequency rest and action tremor bilaterally. No cogwheeling or rigidity. No dysmetria.   REFLEXES: Deep tendon reflexes are symmetrical and normal.    The head CT scan is reviewed in person and also reviewed with the sun. There is a right thalamic hemorrhage which extends to the entire right lateral ventricle and third ventricle. Hydrocephalus is not appreciated. 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 There is moderate to low atrophy.     Blood pressure 142/54, pulse 105, temperature 98.4 F (36.9 C), temperature source Axillary, resp. rate 22, height $RemoveBe'5\' 6"'[id number]$  (1.676 m), weight 78.064 kg (172 lb 1.6 oz), SpO2 97 %.  Past Medical History  Diagnosis Date  . Cancer   . Colon cancer approx 2005    colon resection with resolution  . AKI (acute kidney injury) 03/21/2014  . Hypertension   . Hypercholesterolemia     Past Surgical History  Procedure Laterality Date  . Colectomy  2005    Due to colon cancer    History reviewed. No pertinent family history.  Social History:  reports that he has never smoked. He does not have any smokeless tobacco history on file. He reports that he does not  drink alcohol. His drug history is not on file.  Allergies: No Known Allergies  Medications: Prior to Admission medications   Medication Sig Start Date End Date Taking? Authorizing Provider  aspirin 81 MG tablet Take 81 mg by mouth daily.   Yes Historical Provider, MD  atorvastatin  (LIPITOR) 20 MG tablet Take 20 mg by mouth daily.   Yes Historical Provider, MD  benzonatate (TESSALON) 200 MG capsule Take 200 mg by mouth 3 (three) times daily as needed for cough.   Yes Historical Provider, MD  loratadine (CLARITIN) 10 MG tablet Take 10 mg by mouth daily.   Yes Historical Provider, MD  losartan-hydrochlorothiazide (HYZAAR) 100-12.5 MG per tablet Take 1 tablet by mouth daily.   Yes Historical Provider, MD  pantoprazole (PROTONIX) 40 MG tablet Take 40 mg by mouth daily.   Yes Historical Provider, MD  sertraline (ZOLOFT) 50 MG tablet Take 50 mg by mouth daily.   Yes Historical Provider, MD    Scheduled Meds: . antiseptic oral rinse  7 mL Mouth Rinse BID  . atorvastatin  20 mg Oral q1800  . furosemide  40 mg Intravenous BID  . guaiFENesin  600 mg Oral BID  . hydrALAZINE  50 mg Oral 3 times per day  . ipratropium-albuterol  3 mL Nebulization QID  . loratadine  10 mg Oral Daily  . pantoprazole  40 mg Oral Daily  . piperacillin-tazobactam (ZOSYN)  IV  2.25 g Intravenous Q8H  . sertraline  50 mg Oral Daily  . sodium chloride  3 mL Intravenous Q12H  . sodium chloride  3 mL Intravenous Q12H   Continuous Infusions: . 0.45 % NaCl with KCl 20 mEq / L 125 mL/hr at 03/27/14 0837   PRN Meds:.sodium chloride, acetaminophen, albuterol, sodium chloride     Results for orders placed or performed during the hospital encounter of 03/21/14 (from the past 48 hour(s))  Phosphorus     Status: None   Collection Time: 03/26/14  6:33 AM  Result Value Ref Range   Phosphorus 4.2 2.3 - 4.6 mg/dL  Ferritin     Status: Abnormal   Collection Time: 03/26/14  6:33 AM  Result Value Ref Range   Ferritin 439 (H) 22 - 322 ng/mL    Comment: Performed at [facility]  Iron and TIBC     Status: Abnormal   Collection Time: 03/26/14  6:33 AM  Result Value Ref Range   Iron 39 (L) 42 - 135 ug/dL   TIBC 357 897 - 847 ug/dL   Saturation Ratios 16 (L) 20 - 55 %   UIBC 203 125 - 400 ug/dL     Comment: Performed at [facility]  CBC WITH DIFFERENTIAL     Status: Abnormal   Collection Time: 03/26/14  6:35 AM  Result Value Ref Range   WBC 15.9 (H) 4.0 - 10.5 K/uL   RBC 4.47 4.22 - 5.81 MIL/uL   Hemoglobin 11.0 (L) 13.0 - 17.0 g/dL   HCT 84.1 (L) 28.2 - 08.1 %   MCV 77.0 (L) 78.0 - 100.0 fL   MCH 24.6 (L) 26.0 - 34.0 pg   MCHC 32.0 30.0 - 36.0 g/dL   RDW 38.8 (H) 71.9 - 59.7 %   Platelets 318 150 - 400 K/uL   Neutrophils Relative % 90 (H) 43 - 77 %   Neutro Abs 14.3 (H) 1.7 - 7.7 K/uL   Lymphocytes Relative 5 (L) 12 - 46 %   Lymphs Abs 0.8 0.7 -  4.0 K/uL   Monocytes Relative 5 3 - 12 %   Monocytes Absolute 0.8 0.1 - 1.0 K/uL   Eosinophils Relative 0 0 - 5 %   Eosinophils Absolute 0.0 0.0 - 0.7 K/uL   Basophils Relative 0 0 - 1 %   Basophils Absolute 0.0 0.0 - 0.1 K/uL  Basic metabolic panel     Status: Abnormal   Collection Time: 03/26/14  6:35 AM  Result Value Ref Range   Sodium 141 137 - 147 mEq/L   Potassium 4.0 3.7 - 5.3 mEq/L   Chloride 96 96 - 112 mEq/L   CO2 27 19 - 32 mEq/L   Glucose, Bld 114 (H) 70 - 99 mg/dL   BUN 83 (H) 6 - 23 mg/dL   Creatinine, Ser 2.97 (H) 0.50 - 1.35 mg/dL   Calcium 7.6 (L) 8.4 - 10.5 mg/dL   GFR calc non Af Amer 16 (L) >90 mL/min   GFR calc Af Amer 19 (L) >90 mL/min    Comment: (NOTE) The eGFR has been calculated using the CKD EPI equation. This calculation has not been validated in all clinical situations. eGFR's persistently <90 mL/min signify possible Chronic Kidney Disease.    Anion gap 18 (H) 5 - 15  CBC WITH DIFFERENTIAL     Status: Abnormal   Collection Time: 03/27/14  5:43 AM  Result Value Ref Range   WBC 19.6 (H) 4.0 - 10.5 K/uL   RBC 4.72 4.22 - 5.81 MIL/uL   Hemoglobin 11.9 (L) 13.0 - 17.0 g/dL   HCT 35.8 (L) 39.0 - 52.0 %   MCV 75.8 (L) 78.0 - 100.0 fL   MCH 25.2 (L) 26.0 - 34.0 pg   MCHC 33.2 30.0 - 36.0 g/dL   RDW 16.8 (H) 11.5 - 15.5 %   Platelets 376 150 - 400 K/uL   Neutrophils Relative % 91 (H)  43 - 77 %   Neutro Abs 17.7 (H) 1.7 - 7.7 K/uL   Lymphocytes Relative 4 (L) 12 - 46 %   Lymphs Abs 0.8 0.7 - 4.0 K/uL   Monocytes Relative 5 3 - 12 %   Monocytes Absolute 1.0 0.1 - 1.0 K/uL   Eosinophils Relative 0 0 - 5 %   Eosinophils Absolute 0.0 0.0 - 0.7 K/uL   Basophils Relative 0 0 - 1 %   Basophils Absolute 0.0 0.0 - 0.1 K/uL  Basic metabolic panel     Status: Abnormal   Collection Time: 03/27/14  5:43 AM  Result Value Ref Range   Sodium 139 137 - 147 mEq/L   Potassium 4.1 3.7 - 5.3 mEq/L   Chloride 95 (L) 96 - 112 mEq/L   CO2 26 19 - 32 mEq/L   Glucose, Bld 98 70 - 99 mg/dL   BUN 78 (H) 6 - 23 mg/dL   Creatinine, Ser 2.76 (H) 0.50 - 1.35 mg/dL   Calcium 7.3 (L) 8.4 - 10.5 mg/dL   GFR calc non Af Amer 18 (L) >90 mL/min   GFR calc Af Amer 21 (L) >90 mL/min    Comment: (NOTE) The eGFR has been calculated using the CKD EPI equation. This calculation has not been validated in all clinical situations. eGFR's persistently <90 mL/min signify possible Chronic Kidney Disease.    Anion gap 18 (H) 5 - 15    Studies/Results:     Jakia Kennebrew A. Merlene Cain, M.D.  Diplomate, [employer] and Neurology ( Neurology). 03/27/2014, 6:02 PM

## 2014-03-27 NOTE — Progress Notes (Signed)
[facility] PROGRESS NOTE  Terris Germano JEH:[id number] DOB: 1915/03/31 DOA: 03/21/2014 PCP: No primary care provider on file.  Assessment/Plan: 1. Acute respiratory failure with hypoxia. Felt to be related to pneumonia, possibly acute diastolic congestive heart failure. Patient is on antibiotics and intravenous diuretics. Trying to wean down oxygen as tolerated. 2. Acute kidney injury on chronic kidney disease stage III. Nephrology following, appreciate input. Urinalysis and renal ultrasound ordered which were both unremarkable. He is continued on IV fluids and intravenous Lasix. Continue to monitor urine output. Creatinine is mildly improved today. 3. Pneumonia. Currently on Rocephin and azithromycin. Afebrile. Underwent swallow study today to rule out any underlying aspiration. 4. Acute diastolic congestive heart failure. Cardiology has seen the patient and has signed off.it was not felt that CHF was the cause of his respiratory problems. 5. Possible COPD. Does not be wheezing and was started on intravenous steroids which was transitioned to prednisone. He has not completed his course of steroids. He does not have any wheezing on exam. 6. New left-sided hemiparesis. Patient's family reports that he was noted to have altered mental status starting at approximately 2 PM on 11/17. This morning, his family noted that he had worsening left upper and lower extremity weakness. He was not moving his head to the left side. It is unclear the exact onset of these motor deficits. I have discussed further options regarding workup including CT of head which the family is agreeable. Would increase aspirin from 81 mg to 325 mg. Due to the patient's advanced age, would pursue a very conservative workup. Would not recommend MRI brain at this time since it will be difficult for patient to tolerate the study. We'll also check urinalysis. 7. Essential hypertension. Hydralazine was recently increased. Continue to  follow. 8. Leukocytosis. Likely related to demargination from steroids. He is afebrile. Does not appear toxic.  Code Status: Partial code Family Communication: discussed with daughter at the bedside Disposition Plan: discharge to SNF when stable   Consultants:  Nephrology  cardiology  Procedures:    Antibiotics:  Rocephin 11/12>>  Azithromycin 11/12>>  HPI/Subjective: Confused, cannot provide history  Objective: Filed Vitals:   03/27/14 0623  BP: 168/58  Pulse: 102  Temp: 98 F (36.7 C)  Resp: 20    Intake/Output Summary (Last 24 hours) at 03/27/14 1110 Last data filed at 03/27/14 0906  Gross per 24 hour  Intake    300 ml  Output    300 ml  Net      0 ml   Filed Weights   03/25/14 0500 03/26/14 0300 03/27/14 0500  Weight: 77.8 kg (171 lb 8.3 oz) 78.7 kg (173 lb 8 oz) 78.064 kg (172 lb 1.6 oz)    Exam:   General:  NAD  Cardiovascular: s1, s2, rrr  Respiratory: rhonchi bilaterally  Abdomen: soft, nt, nd, bs+  Musculoskeletal: no edema b/l  Neuro: strength is decreased in left hand grip, normal in right hand grip, he does not lift either leg off th bed, he appears to have some left sided neglect, he is confused and cannot answer questions appropriately  Data Reviewed: Basic Metabolic Panel:  Recent Labs Lab 03/23/14 0500 03/24/14 0517 03/25/14 0521 03/26/14 0633 03/26/14 0635 03/27/14 0543  NA 142 141 144  --  141 139  K 4.3 3.8 3.4*  --  4.0 4.1  CL 100 100 99  --  96 95*  CO2 26 26 30   --  27 26  GLUCOSE 144* 91 116*  --  114* 98  BUN 62* 77* 81*  --  83* 78*  CREATININE 2.63* 2.95* 3.09*  --  2.97* 2.76*  CALCIUM 8.5 7.7* 7.7*  --  7.6* 7.3*  PHOS  --   --   --  4.2  --   --    Liver Function Tests:  Recent Labs Lab 03/21/14 0500 03/22/14 0521  AST 45* 36  ALT 53 43  ALKPHOS 237* 218*  BILITOT 0.2* 0.2*  PROT 7.2 6.9  ALBUMIN 2.9* 2.7*   No results for input(s): LIPASE, AMYLASE in the last 168 hours. No results for  input(s): AMMONIA in the last 168 hours. CBC:  Recent Labs Lab 03/23/14 0500 03/24/14 0517 03/25/14 0521 03/26/14 0635 03/27/14 0543  WBC 11.9* 17.3* 16.1* 15.9* 19.6*  NEUTROABS 11.4* 15.3* 14.4* 14.3* 17.7*  HGB 10.6* 10.3* 10.7* 11.0* 11.9*  HCT 34.3* 31.6* 34.1* 34.4* 35.8*  MCV 79.6 78.4 78.4 77.0* 75.8*  PLT 335 331 338 318 376   Cardiac Enzymes:  Recent Labs Lab 03/21/14 0228 03/21/14 0500 03/21/14 0936 03/21/14 1548  TROPONINI <0.30 <0.30 <0.30 <0.30   BNP (last 3 results)  Recent Labs  03/21/14 0228  PROBNP 2695.0*   CBG: No results for input(s): GLUCAP in the last 168 hours.  Recent Results (from the past 240 hour(s))  Culture, blood (routine x 2)     Status: None   Collection Time: 03/21/14  3:30 AM  Result Value Ref Range Status   Specimen Description BLOOD RIGHT ANTECUBITAL  Final   Special Requests BOTTLES DRAWN AEROBIC AND ANAEROBIC 10CC EACH  Final   Culture NO GROWTH 5 DAYS  Final   Report Status 03/26/2014 FINAL  Final  Culture, blood (routine x 2)     Status: None   Collection Time: 03/21/14  3:35 AM  Result Value Ref Range Status   Specimen Description BLOOD LEFT ANTECUBITAL  Final   Special Requests   Final    BOTTLES DRAWN AEROBIC AND ANAEROBIC AEB=4CC ANA=2CC   Culture NO GROWTH 5 DAYS  Final   Report Status 03/26/2014 FINAL  Final  MRSA PCR Screening     Status: None   Collection Time: 03/22/14 11:33 AM  Result Value Ref Range Status   MRSA by PCR NEGATIVE NEGATIVE Final    Comment:        The GeneXpert MRSA Assay (FDA approved for NASAL specimens only), is one component of a comprehensive MRSA colonization surveillance program. It is not intended to diagnose MRSA infection nor to guide or monitor treatment for MRSA infections.      Studies: Dg Chest Port 1 View  03/26/2014   CLINICAL DATA:  ASPIRATION. PNEUMONIA. HX OF COLON CA. COLECTOMY-2005.HTN. NON SMOKER  EXAM: PORTABLE CHEST - 1 VIEW  COMPARISON:  03/24/2014   FINDINGS: Hazy airspace opacity in the right lower lung zone noted on the prior study is not as evident on the current exam. This suggests improvement although the difference could be technical due to less rotation to the right on the current exam. Lung volumes remain low, vertically on the right with there is elevation the right hemidiaphragm.  No new areas of lung opacity. No pulmonary edema. No convincing pleural effusion and no pneumothorax.  Cardiac silhouette is normal in size.  IMPRESSION: 1. Right lower lung zone opacity noted previously is less apparent consistent with improvement. No new abnormalities.   Electronically Signed   By: Lajean Manes M.D.   On: 03/26/2014 15:51   Dg Swallowing  Func-speech Pathology  03/26/2014   Ephraim Hamburger, CCC-SLP     03/26/2014  7:32 PM Objective Swallowing Evaluation: Modified Barium Swallowing Study   Patient Details  Name: Corey Cain MRN: [id number] Date of Birth: 1914-11-11  Today's Date: 03/26/2014 Time: 3016-0109 SLP Time Calculation (min) (ACUTE ONLY): 26 min  Past Medical History:  Past Medical History  Diagnosis Date  . Cancer   . Colon cancer approx 2005    colon resection with resolution  . AKI (acute kidney injury) 03/21/2014  . Hypertension   . Hypercholesterolemia    Past Surgical History:  Past Surgical History  Procedure Laterality Date  . Colectomy  2005    Due to colon cancer   HPI:  Corey Cain  is a 80 y.o. year old male presenting with  Acute resp failure w/ hypoxia secondary to pneumonia and acute  diastolic CHF. He was admitted to telemetry and starte don IV  antibiotics empirically for pneumonia. Cultures are pending.  Echocardiogram done and on strict intake and output. Cardiac  enzymes are negative and EKG does not show any ischemic changes.  Echocardiogram shows grade 1 diastolic dysfunction and elevated  pulmonary artery pressures. Continue nasal canula oxygen to keep  sats>90%. Urine strep negative. ABg looks okay. Repeat CXR  shows  increasing edema and congestive heart failure with increasing  density in the right upper lobe concerning for aspiration  pneumonia. He is on IV lasix for diuresis and antibiotics changed  to IV zosyn. Mr. Marsalis son present in room for clinical  swallow evaluation and reports that his father has "a great  appetite and no difficulty swallowing". He consumes all meals at  home upright in a chair and masticates his medications if they  are larger per son.     Chest X-ray showing some improvement: IMPRESSION: 1. Right lower lung zone opacity noted previously is less  apparent consistent with improvement. No new abnormalities.  Assessment / Plan / Recommendation Clinical Impression  Dysphagia Diagnosis: Mild oral phase dysphagia;Mild pharyngeal  phase dysphagia;Suspected primary esophageal dysphagia Clinical impression: Mr. Kloosterman was much less alert today than  when seen yesterday in room for clinical swallow evaluation. Son  reports poor sleep since admittance to hospital. Pt presents with  mild oropharyngeal phase dysphagia characterized by reduced  lingual movement and bolus cohesiveness, piecemeal deglutition,  reduced tongue base retraction, mildly decreased hyolaryngeal  excursion resulting in moderate vallecular residue most  pronounced with puree textures (clearance improves with  repeat/dry swallows) and flash penetration of thin liquids during  the swallow without witnessed aspiration. Pt did clear throat  several times during the study. Suspect weakness and current  lethargy negatively impact oropharyngeal swallow at this time.  Recommend down grading textures to mechanical soft and  continuation of thin liquids. Slow rate with cues to repeat/dry  swallow throughout meals. Pt at risk for prandial and post  prandial aspiration due to significantly decreased esophageal  motility with delayed emptying of esophagus.     Treatment Recommendation  Therapy as outlined in treatment plan below    Diet  Recommendation Dysphagia 3 (Mechanical Soft);Thin liquid   Liquid Administration via: Cup;Straw Medication Administration: Whole meds with liquid Supervision: Patient able to self feed;Intermittent supervision  to cue for compensatory strategies Compensations: Slow rate;Multiple dry swallows after each  bite/sip Postural Changes and/or Swallow Maneuvers: Seated upright 90  degrees;Upright 30-60 min after meal;Out of bed for meals    Other  Recommendations Other Recommendations: Clarify dietary  restrictions  Follow Up Recommendations  Skilled Nursing facility    Frequency and Duration min 2x/week  1 week   Pertinent Vitals/Pain VSS    SLP Swallow Goals   Pt will demonstrate safe and efficient consumption of least  restrictive diet with use of strategies as needed.   General Date of Onset: 03/21/14 HPI: Corey Cain  is a 79 y.o. year old male presenting  with Acute resp failure w/ hypoxia secondary to pneumonia and  acute diastolic CHF. He was admitted to telemetry and starte don  IV antibiotics empirically for pneumonia. Cultures are pending.  Echocardiogram done and on strict intake and output. Cardiac  enzymes are negative and EKG does not show any ischemic changes.  Echocardiogram shows grade 1 diastolic dysfunction and elevated  pulmonary artery pressures. Continue nasal canula oxygen to keep  sats>90%. Urine strep negative. ABg looks okay. Repeat CXR shows  increasing edema and congestive heart failure with increasing  density in the right upper lobe concerning for aspiration  pneumonia. He is on IV lasix for diuresis and antibiotics changed  to IV zosyn. Mr. Strickling son present in room for clinical  swallow evaluation and reports that his father has "a great  appetite and no difficulty swallowing". He consumes all meals at  home upright in a chair and masticates his medications if they  are larger per son.  Type of Study: Modified Barium Swallowing Study Reason for Referral: Objectively evaluate  swallowing function Previous Swallow Assessment: None on record; son reports h/o  esophageal dilation in the past Diet Prior to this Study: Regular;Thin liquids Temperature Spikes Noted: No Respiratory Status: Nasal cannula History of Recent Intubation: No Behavior/Cognition: Cooperative;Pleasant mood;Lethargic Oral Cavity - Dentition: Adequate natural dentition Oral Motor / Sensory Function: Within functional limits Self-Feeding Abilities: Needs set up Patient Positioning: Upright in chair Baseline Vocal Quality: Low vocal intensity;Clear (can hear  congestion) Volitional Cough: Congested Volitional Swallow: Able to elicit Anatomy: Within functional limits (Pt leaning heavily to right  during exam) Pharyngeal Secretions: Not observed secondary MBS    Reason for Referral Objectively evaluate swallowing function   Oral Phase Oral Preparation/Oral Phase Oral Phase: Impaired Oral - Solids Oral - Puree: Delayed oral transit;Lingual/palatal  residue;Piecemeal swallowing;Reduced posterior propulsion Oral - Regular: Reduced posterior propulsion;Lingual/palatal  residue;Piecemeal swallowing;Delayed oral transit Oral - Pill:  (Pt masticated pill despite request to swallow  whole) Oral Phase - Comment Oral Phase - Comment: Mild slowness with oral prep, weak AP  transit with decreased bolus cohesion   Pharyngeal Phase Pharyngeal Phase Pharyngeal Phase: Impaired Pharyngeal - Thin Pharyngeal - Thin Cup: Delayed swallow initiation;Premature  spillage to valleculae;Pharyngeal residue -  valleculae;Penetration/Aspiration during swallow;Reduced anterior  laryngeal mobility;Reduced laryngeal elevation Penetration/Aspiration details (thin cup): Material does not  enter airway;Material enters airway, remains ABOVE vocal cords  then ejected out Pharyngeal - Thin Straw: Delayed swallow initiation;Premature  spillage to valleculae;Premature spillage to pyriform  sinuses;Reduced anterior laryngeal mobility;Reduced laryngeal   elevation;Penetration/Aspiration during swallow;Pharyngeal  residue - valleculae Penetration/Aspiration details (thin straw): Material does not  enter airway;Material enters airway, remains ABOVE vocal cords  then ejected out Pharyngeal - Solids Pharyngeal - Puree: Delayed swallow initiation;Premature spillage  to valleculae;Reduced anterior laryngeal mobility;Reduced  laryngeal elevation;Pharyngeal residue - valleculae;Reduced  tongue base retraction Pharyngeal - Regular: Delayed swallow initiation;Premature  spillage to valleculae;Reduced anterior laryngeal  mobility;Reduced tongue base retraction;Reduced laryngeal  elevation;Pharyngeal residue - valleculae Pharyngeal - Pill:  (pt masticated pill)  Cervical Esophageal Phase       Thank  you,  Genene Churn, CCC-SLP [phone]  Cervical Esophageal Phase Cervical Esophageal Phase: Impaired Cervical Esophageal Phase - Comment Cervical Esophageal Comment: delayed emptying of esophagus;  barium retained throughout the study despite liquid wash; reduced  motility         PORTER,DABNEY 03/26/2014, 7:31 PM     Scheduled Meds: . antiseptic oral rinse  7 mL Mouth Rinse BID  . aspirin  325 mg Oral Daily  . atorvastatin  20 mg Oral q1800  . furosemide  60 mg Intravenous BID  . guaiFENesin  600 mg Oral BID  . heparin  5,000 Units Subcutaneous 3 times per day  . hydrALAZINE  50 mg Oral 3 times per day  . ipratropium-albuterol  3 mL Nebulization QID  . loratadine  10 mg Oral Daily  . pantoprazole  40 mg Oral Daily  . piperacillin-tazobactam (ZOSYN)  IV  2.25 g Intravenous Q8H  . sertraline  50 mg Oral Daily  . sodium chloride  3 mL Intravenous Q12H  . sodium chloride  3 mL Intravenous Q12H   Continuous Infusions: . 0.45 % NaCl with KCl 20 mEq / L 125 mL/hr at 03/27/14 0223    Active Problems:   Acute respiratory failure with hypoxia   AKI (acute kidney injury)   CKD (chronic kidney disease), stage III   CAP (community acquired pneumonia)   Anemia    Leukocytosis   Aspiration pneumonia   Hypokalemia   Constipation     Time spent: 43mins    MEMON,JEHANZEB  [facility] Pager [phone]. If 7PM-7AM, please contact night-coverage at [email or web address], password [facility] 03/27/2014, 11:10 AM  LOS: 6 days

## 2014-03-28 DIAGNOSIS — I615 Nontraumatic intracerebral hemorrhage, intraventricular: Secondary | ICD-10-CM

## 2014-03-28 LAB — CBC WITH DIFFERENTIAL/PLATELET
BASOS ABS: 0 10*3/uL (ref 0.0–0.1)
BASOS PCT: 0 % (ref 0–1)
EOS ABS: 0.1 10*3/uL (ref 0.0–0.7)
Eosinophils Relative: 0 % (ref 0–5)
HCT: 31.2 % — ABNORMAL LOW (ref 39.0–52.0)
Hemoglobin: 10 g/dL — ABNORMAL LOW (ref 13.0–17.0)
LYMPHS ABS: 1 10*3/uL (ref 0.7–4.0)
Lymphocytes Relative: 7 % — ABNORMAL LOW (ref 12–46)
MCH: 24.3 pg — AB (ref 26.0–34.0)
MCHC: 32.1 g/dL (ref 30.0–36.0)
MCV: 75.9 fL — ABNORMAL LOW (ref 78.0–100.0)
Monocytes Absolute: 0.8 10*3/uL (ref 0.1–1.0)
Monocytes Relative: 6 % (ref 3–12)
NEUTROS PCT: 87 % — AB (ref 43–77)
Neutro Abs: 12.1 10*3/uL — ABNORMAL HIGH (ref 1.7–7.7)
PLATELETS: 336 10*3/uL (ref 150–400)
RBC: 4.11 MIL/uL — AB (ref 4.22–5.81)
RDW: 16.8 % — AB (ref 11.5–15.5)
WBC: 14 10*3/uL — ABNORMAL HIGH (ref 4.0–10.5)

## 2014-03-28 LAB — BASIC METABOLIC PANEL
ANION GAP: 19 — AB (ref 5–15)
BUN: 75 mg/dL — ABNORMAL HIGH (ref 6–23)
CALCIUM: 6.8 mg/dL — AB (ref 8.4–10.5)
CO2: 25 mEq/L (ref 19–32)
Chloride: 94 mEq/L — ABNORMAL LOW (ref 96–112)
Creatinine, Ser: 3.07 mg/dL — ABNORMAL HIGH (ref 0.50–1.35)
GFR, EST AFRICAN AMERICAN: 18 mL/min — AB (ref >90)
GFR, EST NON AFRICAN AMERICAN: 16 mL/min — AB (ref >90)
Glucose, Bld: 94 mg/dL (ref 70–99)
POTASSIUM: 3.8 meq/L (ref 3.7–5.3)
SODIUM: 138 meq/L (ref 137–147)

## 2014-03-28 MED ORDER — MORPHINE SULFATE 2 MG/ML IJ SOLN
1.0000 mg | Freq: Once | INTRAMUSCULAR | Status: AC
  Administered 2014-03-28: 1 mg via INTRAVENOUS
  Filled 2014-03-28: qty 1

## 2014-03-28 MED ORDER — LORAZEPAM 2 MG/ML IJ SOLN: 1.0000 mg | Freq: Once | INTRAMUSCULAR | Status: DC

## 2014-03-28 MED ORDER — IPRATROPIUM-ALBUTEROL 0.5-2.5 (3) MG/3ML IN SOLN
3.0000 mL | Freq: Three times a day (TID) | RESPIRATORY_TRACT | Status: DC
  Administered 2014-03-28 – 2014-03-30 (×6): 3 mL via RESPIRATORY_TRACT
  Filled 2014-03-28 (×6): qty 3

## 2014-03-28 MED ORDER — DEXTROSE-NACL 5-0.45 % IV SOLN
INTRAVENOUS | Status: DC
  Administered 2014-03-28 – 2014-03-29 (×2): via INTRAVENOUS

## 2014-03-28 MED ORDER — MORPHINE SULFATE 2 MG/ML IJ SOLN
1.0000 mg | INTRAMUSCULAR | Status: DC | PRN
  Administered 2014-03-28 – 2014-03-29 (×4): 1 mg via INTRAVENOUS
  Filled 2014-03-28 (×5): qty 1

## 2014-03-28 NOTE — Progress Notes (Signed)
Subjective: Interval History: Patient does not communicate today and discussed with his families this morning.  Objective: Vital signs in last 24 hours: Temp:  [98 F (36.7 C)-98.4 F (36.9 C)] 98 F (36.7 C) (11/19 0458) Pulse Rate:  [100-105] 100 (11/19 0458) Resp:  [20-22] 22 (11/19 0458) BP: (142-160)/(54-60) 160/60 mmHg (11/19 0458) SpO2:  [94 %-98 %] 96 % (11/19 0809) Weight:  [77.82 kg (171 lb 9 oz)] 77.82 kg (171 lb 9 oz) (11/19 0458) Weight change: -0.244 kg (-8.6 oz)  Intake/Output from previous day: 11/18 0701 - 11/19 0700 In: 500 [P.O.:500] Out: -  Intake/Output this shift:    Patient moaning and growling and does not answer any question Chest decrease breath sound posteriorly. Heart RRR No edema  Lab Results:  Recent Labs  03/27/14 0543 03/28/14 0602  WBC 19.6* 14.0*  HGB 11.9* 10.0*  HCT 35.8* 31.2*  PLT 376 336   BMET:   Recent Labs  03/27/14 0543 03/28/14 0602  NA 139 138  K 4.1 3.8  CL 95* 94*  CO2 26 25  GLUCOSE 98 94  BUN 78* 75*  CREATININE 2.76* 3.07*  CALCIUM 7.3* 6.8*   No results for input(s): PTH in the last 72 hours. Iron Studies:   Recent Labs  03/26/14 0633  IRON 39*  TIBC 242  FERRITIN 439*    Studies/Results: Ct Head Wo Contrast  03/27/2014   CLINICAL DATA:  New left-sided weakness. Altered mental status began yesterday afternoon. Patient developed left upper lower extremity weakness this morning.  History of respiratory failure and acute injury to the kidneys. History of hypertension.  EXAM: CT HEAD WITHOUT CONTRAST  TECHNIQUE: Contiguous axial images were obtained from the base of the skull through the vertex without intravenous contrast.  COMPARISON:  None  FINDINGS: There is acute hemorrhage filling the right lateral ventricle, partly filling the third ventricle, with a small amount dependent hemorrhage noted in the posterior left lateral ventricle. The source of this hemorrhage may be the right caudate nucleus head  or anterior right thalamus. The right lateral ventricle is distended in relation to the left. Right lateral ventricle atrium measures 24 mm in greatest width with the left measuring 18 mm.  The remaining ventricles are enlarged, wall the sulci, reflecting moderate atrophy.  No parenchymal masses. No evidence of a recent cortical infarct. Patchy areas of white matter hypoattenuation are noted consistent with moderate chronic microvascular ischemic change. This extends to a small focus of the cortex of posterior right parietal lobe consistent with an old infarct.  No extra-axial masses.  Fluid mucosal thickening mostly opacifies the right maxillary sinus. There are dependent secretions of mild mucosal thickening in the left maxillary sinus. There is mild to moderate ethmoid sinus mucosal thickening. Right sphenoid sinus is partly opacified by mucosal thickening. Mastoid air cells are clear.  IMPRESSION: 1. Acute intracranial hemorrhage. Hemorrhage fills the right lateral ventricle and extends into the third ventricle. Minimal dependent hemorrhage is noted in the left lateral ventricle. Hemorrhage is likely from either the right caudate nucleus head or anterior right thalamus, rupturing into the ventricular system. Critical Value/emergent results were called by telephone at the time of interpretation on 03/27/2014 at 4:48 pm to Dr. Jolaine Artist [facility] , who verbally acknowledged these results. 2. Right lateral ventricle was mildly dilated in relation to the left. There is no convincing diffuse hydrocephalus. 3. No other acute finding. Moderate generalized atrophy and chronic microvascular ischemic change. 4. Sinus disease as described above.   Electronically  Signed   By: Lajean Manes M.D.   On: 03/27/2014 16:48   Dg Chest Port 1 View  03/26/2014   CLINICAL DATA:  ASPIRATION. PNEUMONIA. HX OF COLON CA. COLECTOMY-2005.HTN. NON SMOKER  EXAM: PORTABLE CHEST - 1 VIEW  COMPARISON:  03/24/2014  FINDINGS: Hazy airspace opacity  in the right lower lung zone noted on the prior study is not as evident on the current exam. This suggests improvement although the difference could be technical due to less rotation to the right on the current exam. Lung volumes remain low, vertically on the right with there is elevation the right hemidiaphragm.  No new areas of lung opacity. No pulmonary edema. No convincing pleural effusion and no pneumothorax.  Cardiac silhouette is normal in size.  IMPRESSION: 1. Right lower lung zone opacity noted previously is less apparent consistent with improvement. No new abnormalities.   Electronically Signed   By: Lajean Manes M.D.   On: 03/26/2014 15:51   Dg Swallowing Func-speech Pathology  03/26/2014   Ephraim Hamburger, CCC-SLP     03/26/2014  7:32 PM Objective Swallowing Evaluation: Modified Barium Swallowing Study   Patient Details  Name: Corey Cain MRN: [id number] Date of Birth: Nov 28, 1914  Today's Date: 03/26/2014 Time: 2355-7322 SLP Time Calculation (min) (ACUTE ONLY): 26 min  Past Medical History:  Past Medical History  Diagnosis Date  . Cancer   . Colon cancer approx 2005    colon resection with resolution  . AKI (acute kidney injury) 03/21/2014  . Hypertension   . Hypercholesterolemia    Past Surgical History:  Past Surgical History  Procedure Laterality Date  . Colectomy  2005    Due to colon cancer   HPI:  Corey Cain  is a 80 y.o. year old male presenting with  Acute resp failure w/ hypoxia secondary to pneumonia and acute  diastolic CHF. He was admitted to telemetry and starte don IV  antibiotics empirically for pneumonia. Cultures are pending.  Echocardiogram done and on strict intake and output. Cardiac  enzymes are negative and EKG does not show any ischemic changes.  Echocardiogram shows grade 1 diastolic dysfunction and elevated  pulmonary artery pressures. Continue nasal canula oxygen to keep  sats>90%. Urine strep negative. ABg looks okay. Repeat CXR shows  increasing edema and  congestive heart failure with increasing  density in the right upper lobe concerning for aspiration  pneumonia. He is on IV lasix for diuresis and antibiotics changed  to IV zosyn. Mr. Seder son present in room for clinical  swallow evaluation and reports that his father has "a great  appetite and no difficulty swallowing". He consumes all meals at  home upright in a chair and masticates his medications if they  are larger per son.     Chest X-ray showing some improvement: IMPRESSION: 1. Right lower lung zone opacity noted previously is less  apparent consistent with improvement. No new abnormalities.  Assessment / Plan / Recommendation Clinical Impression  Dysphagia Diagnosis: Mild oral phase dysphagia;Mild pharyngeal  phase dysphagia;Suspected primary esophageal dysphagia Clinical impression: Mr. Mangino was much less alert today than  when seen yesterday in room for clinical swallow evaluation. Son  reports poor sleep since admittance to hospital. Pt presents with  mild oropharyngeal phase dysphagia characterized by reduced  lingual movement and bolus cohesiveness, piecemeal deglutition,  reduced tongue base retraction, mildly decreased hyolaryngeal  excursion resulting in moderate vallecular residue most  pronounced with puree textures (clearance improves with  repeat/dry swallows) and  flash penetration of thin liquids during  the swallow without witnessed aspiration. Pt did clear throat  several times during the study. Suspect weakness and current  lethargy negatively impact oropharyngeal swallow at this time.  Recommend down grading textures to mechanical soft and  continuation of thin liquids. Slow rate with cues to repeat/dry  swallow throughout meals. Pt at risk for prandial and post  prandial aspiration due to significantly decreased esophageal  motility with delayed emptying of esophagus.     Treatment Recommendation  Therapy as outlined in treatment plan below    Diet Recommendation Dysphagia 3  (Mechanical Soft);Thin liquid   Liquid Administration via: Cup;Straw Medication Administration: Whole meds with liquid Supervision: Patient able to self feed;Intermittent supervision  to cue for compensatory strategies Compensations: Slow rate;Multiple dry swallows after each  bite/sip Postural Changes and/or Swallow Maneuvers: Seated upright 90  degrees;Upright 30-60 min after meal;Out of bed for meals    Other  Recommendations Other Recommendations: Clarify dietary  restrictions   Follow Up Recommendations  Skilled Nursing facility    Frequency and Duration min 2x/week  1 week   Pertinent Vitals/Pain VSS    SLP Swallow Goals   Pt will demonstrate safe and efficient consumption of least  restrictive diet with use of strategies as needed.   General Date of Onset: 03/21/14 HPI: Mr. Derrien Anschutz  is a 80 y.o. year old male presenting  with Acute resp failure w/ hypoxia secondary to pneumonia and  acute diastolic CHF. He was admitted to telemetry and starte don  IV antibiotics empirically for pneumonia. Cultures are pending.  Echocardiogram done and on strict intake and output. Cardiac  enzymes are negative and EKG does not show any ischemic changes.  Echocardiogram shows grade 1 diastolic dysfunction and elevated  pulmonary artery pressures. Continue nasal canula oxygen to keep  sats>90%. Urine strep negative. ABg looks okay. Repeat CXR shows  increasing edema and congestive heart failure with increasing  density in the right upper lobe concerning for aspiration  pneumonia. He is on IV lasix for diuresis and antibiotics changed  to IV zosyn. Mr. Fernando son present in room for clinical  swallow evaluation and reports that his father has "a great  appetite and no difficulty swallowing". He consumes all meals at  home upright in a chair and masticates his medications if they  are larger per son.  Type of Study: Modified Barium Swallowing Study Reason for Referral: Objectively evaluate swallowing function Previous  Swallow Assessment: None on record; son reports h/o  esophageal dilation in the past Diet Prior to this Study: Regular;Thin liquids Temperature Spikes Noted: No Respiratory Status: Nasal cannula History of Recent Intubation: No Behavior/Cognition: Cooperative;Pleasant mood;Lethargic Oral Cavity - Dentition: Adequate natural dentition Oral Motor / Sensory Function: Within functional limits Self-Feeding Abilities: Needs set up Patient Positioning: Upright in chair Baseline Vocal Quality: Low vocal intensity;Clear (can hear  congestion) Volitional Cough: Congested Volitional Swallow: Able to elicit Anatomy: Within functional limits (Pt leaning heavily to right  during exam) Pharyngeal Secretions: Not observed secondary MBS    Reason for Referral Objectively evaluate swallowing function   Oral Phase Oral Preparation/Oral Phase Oral Phase: Impaired Oral - Solids Oral - Puree: Delayed oral transit;Lingual/palatal  residue;Piecemeal swallowing;Reduced posterior propulsion Oral - Regular: Reduced posterior propulsion;Lingual/palatal  residue;Piecemeal swallowing;Delayed oral transit Oral - Pill:  (Pt masticated pill despite request to swallow  whole) Oral Phase - Comment Oral Phase - Comment: Mild slowness with oral prep, weak AP  transit with decreased bolus  cohesion   Pharyngeal Phase Pharyngeal Phase Pharyngeal Phase: Impaired Pharyngeal - Thin Pharyngeal - Thin Cup: Delayed swallow initiation;Premature  spillage to valleculae;Pharyngeal residue -  valleculae;Penetration/Aspiration during swallow;Reduced anterior  laryngeal mobility;Reduced laryngeal elevation Penetration/Aspiration details (thin cup): Material does not  enter airway;Material enters airway, remains ABOVE vocal cords  then ejected out Pharyngeal - Thin Straw: Delayed swallow initiation;Premature  spillage to valleculae;Premature spillage to pyriform  sinuses;Reduced anterior laryngeal mobility;Reduced laryngeal  elevation;Penetration/Aspiration during  swallow;Pharyngeal  residue - valleculae Penetration/Aspiration details (thin straw): Material does not  enter airway;Material enters airway, remains ABOVE vocal cords  then ejected out Pharyngeal - Solids Pharyngeal - Puree: Delayed swallow initiation;Premature spillage  to valleculae;Reduced anterior laryngeal mobility;Reduced  laryngeal elevation;Pharyngeal residue - valleculae;Reduced  tongue base retraction Pharyngeal - Regular: Delayed swallow initiation;Premature  spillage to valleculae;Reduced anterior laryngeal  mobility;Reduced tongue base retraction;Reduced laryngeal  elevation;Pharyngeal residue - valleculae Pharyngeal - Pill:  (pt masticated pill)  Cervical Esophageal Phase       Thank you,  Genene Churn, CCC-SLP [phone]  Cervical Esophageal Phase Cervical Esophageal Phase: Impaired Cervical Esophageal Phase - Comment Cervical Esophageal Comment: delayed emptying of esophagus;  barium retained throughout the study despite liquid wash; reduced  motility         PORTER,DABNEY 03/26/2014, 7:31 PM     I have reviewed the patient's current medications.  Assessment/Plan: Problem #1 acute kidney injury: His BUN and creatinine is increasing with decrease urine out put Problem #2 chronic kidney disease: Stage III Problem #3 hypokalemia: Potassium is with in normal range and patient is on potassium supplement Problem #4 difficulty in breathing: Difficult to asses Problem #5 hypertension: Her blood pressure is reasonably controlled Problem #6 anemia Problem #7 metabolic bone disease his calcium is range  Problem#8 Acute intracranial heamorage. Seen by neurology Because of his CNS bleeding and old age his prognosis seems to be poor and may benefit from confort only measure. Plan: d.c 1/2ns with kcl Start on d5w with 1/2ns at 75 cc/ min   LOS: 7 days   Clinten Howk S 03/28/2014,8:15 AM

## 2014-03-28 NOTE — Progress Notes (Signed)
Pt was found in bed awake and able to squeeze my hand with his right hand but not the left.  He was lying fully on his right side at the time of my visit.  In view of this very recent cranial bleed, I am going to clarify with the MD as to whether we should do any significant mobilization of this pt.  Son is in agreement.

## 2014-03-28 NOTE — Clinical Social Work Note (Signed)
CSW spoke with patient's attending who indicated that he had spoke with patient's son about Hospice due to patient's current condition. CSW met with patient's son and received permission to send clinicals to [facility]. Patient's son indicated that he wanted to meet with Hospice after 2:30 when at least one of his siblings could be present.    CSW sent clinicals to [facility] spoke to [place] and advised that the clinicals had been sent. Jenny Reichmann indicated that she had received the clinicals.  CSW indicated that the family wanted to meet after 2:30.  Jenny Reichmann indicated that she would meet with family today at 6.  CSW indicated spoke with patient's son and advised that Jenny Reichmann from Hospice would come to meet with the family today at 25. Patient's son agreed.   Ambrose Pancoast, [place]

## 2014-03-28 NOTE — Progress Notes (Signed)
Speech Language Pathology Treatment:    Patient Details Name: Slate Debroux MRN: [id number] DOB: Jul 12, 1914 Today's Date: 03/28/2014 Time: 10:55 AM  - 11:36 AM    Assessment / Plan / Recommendation Clinical Impression  Unfortunately, pt had intracranial hemorrhage. 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  He is less alert, but still able to answer yes/no questions and follow simple commands with delay. Pt with congested cough and breathing prior to po administrations. Son reports that pt choked on eggs and sausage yesterday, but did well with pudding and milk today. Pt assessed with pudding and straw sips thin water and milk. Pt required cues to close lips around straw and suck. Once initiated, he was able to take several swallows. While Mr. Higginbotham did not cough immediately with intake, swallow initiation is delayed and pt is judged to be at high risk for aspiration given current state (pneumonia with acute stroke and lethargy).  He would be slightly safer with puree and nectar-thick liquids. Discussed with pt's son and he is in agreement with plan to downgrade diet to D1/NTL. Neurologist feels that prognosis is poor and family may choose comfort care after discussion with Hospice. Consider liberalizing diet to thin liquids if pt on comfort care if that is what he desires. OK for RN to give ice chips after oral care. SLP will follow pending family/MD decisions.    HPI HPI: Mr. Sahej Schrieber  is a 80 y.o. year old male presenting with Acute resp failure w/ hypoxia secondary to pneumonia and acute diastolic CHF. He was admitted to telemetry and starte don IV antibiotics empirically for pneumonia. Cultures are pending. Echocardiogram done and on strict intake and output. Cardiac enzymes are negative and EKG does not show any ischemic changes. Echocardiogram shows grade 1 diastolic dysfunction and elevated pulmonary artery pressures. Continue nasal canula oxygen to keep  sats>90%. Urine strep negative. ABg looks okay. Repeat CXR shows increasing edema and congestive heart failure with increasing density in the right upper lobe concerning for aspiration pneumonia. He is on IV lasix for diuresis and antibiotics changed to IV zosyn. Mr. Favorite son present in room for clinical swallow evaluation and reports that his father has "a great appetite and no difficulty swallowing". He consumes all meals at home upright in a chair and masticates his medications if they are larger per son.    Dr. Freddie Apley note: pt had intracranial hemorrhage. 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  Given the extensive nature of the hemorrhage, believe the patient's prognosis is poor. It is unlikely that the patient will survive this hemorrhage. This is discussed with the family. Comfort care measures are recommended    Pertinent Vitals    SLP Plan  Goals updated    Recommendations Liquids provided via: Straw Medication Administration: Crushed with puree Supervision: Full supervision/cueing for compensatory strategies Compensations: Slow rate;Small sips/bites;Check for pocketing;Check for anterior loss;Multiple dry swallows after each bite/sip Postural Changes and/or Swallow Maneuvers: Seated upright 90 degrees;Upright 30-60 min after meal              Oral Care Recommendations: Oral care BID;Oral care prior to ice chips Plan: Goals updated   Thank you,  Genene Churn, [place]      [place] 03/28/2014, 11:47 AM

## 2014-03-28 NOTE — Progress Notes (Signed)
Patient ID: Corey Cain, male   DOB: [birth year], 80 y.o.   MRN: [id number]   Ragan A. Merlene Laughter, MD     [email or web address]          Corey Cain is an 80 y.o. male.   Assessment/Plan: 1. R -thalamic hypertensive hemorrhage with a large ventricular extension. Fortunately for the patient and he has not developed expected acute hydrocephalus or extensive associated edema. His examination has stabilized. It appears that the patient therefore may not have massive herniated syndrome which is typically associated with death from acute hemorrhage. Ultimately, the patient may still succumb to complications of this hemorrhage but may be longer than expected. The case discussed extensively with the family.   New information from the son-in-law indicates that the patient appears to have developed relatively acute visual impairment with left-sided neglect on Sunday. This may have been the onset of his hemorrhage and would put his hemorrhage about 5 days at this time. The 3-5 day mark is typically within the timeframe that we see massive  edema and hydrocephalus leading to malignant herniation syndrome.    Symmetric tremors likely due to acute medical illness/anxiety/medication effect.       GENERAL: In some discomfort but no acute distress.  HEENT: Supple. Atraumatic normocephalic.   ABDOMEN: soft  EXTREMITIES: No edema   BACK: Normal.  SKIN: Normal by inspection.   MENTAL STATUS: The patient lays in bed with eyes open. He does speak and 2 to 3 word sentences when prompted. He states he is doing okay. He does follow commands bilaterally.  CRANIAL NERVES: Pupils are equal, round and reactive to light; he does vigorously resists eye opening; extra ocular movements seems are full - , there is no significant nystagmus; there does appears to be left-sided neglect. Facial muscle strength is symmetric.  MOTOR:  He holds up 2 fingers on right side and numbness of the  left. The right side is about 4 and the left approximately 3.  COORDINATION: There is constant to moderate amplitude moderate frequency rest and action tremor bilaterally. No cogwheeling or rigidity. No dysmetria.   REFLEXES: Deep tendon reflexes are symmetrical and normal.    The head CT scan is reviewed in person and also reviewed with the sun. There is a right thalamic hemorrhage which extends to the entire right lateral ventricle and third ventricle. Hydrocephalus is not appreciated. 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 There is moderate to low atrophy.     Objective: Vital signs in last 24 hours: Temp:  [98 F (36.7 C)-98.1 F (36.7 C)] 98 F (36.7 C) (11/19 0458) Pulse Rate:  [100-101] 100 (11/19 0458) Resp:  [20-22] 20 (11/19 1347) BP: (156-160)/(60) 160/60 mmHg (11/19 0458) SpO2:  [94 %-99 %] 99 % (11/19 1347) Weight:  [77.82 kg (171 lb 9 oz)] 77.82 kg (171 lb 9 oz) (11/19 0458)  Intake/Output from previous day: 11/18 0701 - 11/19 0700 In: 500 [P.O.:500] Out: -  Intake/Output this shift:   Nutritional status: DIET - DYS 1   Lab Results: Results for orders placed or performed during the hospital encounter of 03/21/14 (from the past 48 hour(s))  CBC WITH DIFFERENTIAL     Status: Abnormal   Collection Time: 03/27/14  5:43 AM  Result Value Ref Range   WBC 19.6 (H) 4.0 - 10.5 K/uL   RBC 4.72 4.22 - 5.81 MIL/uL   Hemoglobin 11.9 (L) 13.0 - 17.0 g/dL   HCT 35.8 (L) 39.0 - 52.0 %   MCV 75.8 (  L) 78.0 - 100.0 fL   MCH 25.2 (L) 26.0 - 34.0 pg   MCHC 33.2 30.0 - 36.0 g/dL   RDW 59.0 (H) 19.1 - 70.1 %   Platelets 376 150 - 400 K/uL   Neutrophils Relative % 91 (H) 43 - 77 %   Neutro Abs 17.7 (H) 1.7 - 7.7 K/uL   Lymphocytes Relative 4 (L) 12 - 46 %   Lymphs Abs 0.8 0.7 - 4.0 K/uL   Monocytes Relative 5 3 - 12 %   Monocytes Absolute 1.0 0.1 - 1.0 K/uL   Eosinophils Relative 0 0 - 5 %   Eosinophils Absolute 0.0 0.0 - 0.7 K/uL   Basophils Relative 0 0 - 1 %     Basophils Absolute 0.0 0.0 - 0.1 K/uL  Basic metabolic panel     Status: Abnormal   Collection Time: 03/27/14  5:43 AM  Result Value Ref Range   Sodium 139 137 - 147 mEq/L   Potassium 4.1 3.7 - 5.3 mEq/L   Chloride 95 (L) 96 - 112 mEq/L   CO2 26 19 - 32 mEq/L   Glucose, Bld 98 70 - 99 mg/dL   BUN 78 (H) 6 - 23 mg/dL   Creatinine, Ser 5.26 (H) 0.50 - 1.35 mg/dL   Calcium 7.3 (L) 8.4 - 10.5 mg/dL   GFR calc non Af Amer 18 (L) >90 mL/min   GFR calc Af Amer 21 (L) >90 mL/min    Comment: (NOTE) The eGFR has been calculated using the CKD EPI equation. This calculation has not been validated in all clinical situations. eGFR's persistently <90 mL/min signify possible Chronic Kidney Disease.    Anion gap 18 (H) 5 - 15  CBC WITH DIFFERENTIAL     Status: Abnormal   Collection Time: 03/28/14  6:02 AM  Result Value Ref Range   WBC 14.0 (H) 4.0 - 10.5 K/uL   RBC 4.11 (L) 4.22 - 5.81 MIL/uL   Hemoglobin 10.0 (L) 13.0 - 17.0 g/dL   HCT 67.3 (L) 38.8 - 32.6 %   MCV 75.9 (L) 78.0 - 100.0 fL   MCH 24.3 (L) 26.0 - 34.0 pg   MCHC 32.1 30.0 - 36.0 g/dL   RDW 23.5 (H) 66.8 - 87.1 %   Platelets 336 150 - 400 K/uL   Neutrophils Relative % 87 (H) 43 - 77 %   Neutro Abs 12.1 (H) 1.7 - 7.7 K/uL   Lymphocytes Relative 7 (L) 12 - 46 %   Lymphs Abs 1.0 0.7 - 4.0 K/uL   Monocytes Relative 6 3 - 12 %   Monocytes Absolute 0.8 0.1 - 1.0 K/uL   Eosinophils Relative 0 0 - 5 %   Eosinophils Absolute 0.1 0.0 - 0.7 K/uL   Basophils Relative 0 0 - 1 %   Basophils Absolute 0.0 0.0 - 0.1 K/uL  Basic metabolic panel     Status: Abnormal   Collection Time: 03/28/14  6:02 AM  Result Value Ref Range   Sodium 138 137 - 147 mEq/L   Potassium 3.8 3.7 - 5.3 mEq/L   Chloride 94 (L) 96 - 112 mEq/L   CO2 25 19 - 32 mEq/L   Glucose, Bld 94 70 - 99 mg/dL   BUN 75 (H) 6 - 23 mg/dL   Creatinine, Ser 0.97 (H) 0.50 - 1.35 mg/dL   Calcium 6.8 (L) 8.4 - 10.5 mg/dL   GFR calc non Af Amer 16 (L) >90 mL/min   GFR calc Af  Denyse Dago  18 (L) >90 mL/min    Comment: (NOTE) The eGFR has been calculated using the CKD EPI equation. This calculation has not been validated in all clinical situations. eGFR's persistently <90 mL/min signify possible Chronic Kidney Disease.    Anion gap 19 (H) 5 - 15    Lipid Panel No results for input(s): CHOL, TRIG, HDL, CHOLHDL, VLDL, LDLCALC in the last 72 hours.  Studies/Results:   Medications:  Scheduled Meds: . antiseptic oral rinse  7 mL Mouth Rinse BID  . atorvastatin  20 mg Oral q1800  . furosemide  40 mg Intravenous BID  . guaiFENesin  600 mg Oral BID  . hydrALAZINE  50 mg Oral 3 times per day  . ipratropium-albuterol  3 mL Nebulization TID  . loratadine  10 mg Oral Daily  . pantoprazole  40 mg Oral Daily  . piperacillin-tazobactam (ZOSYN)  IV  2.25 g Intravenous Q8H  . sertraline  50 mg Oral Daily  . sodium chloride  3 mL Intravenous Q12H  . sodium chloride  3 mL Intravenous Q12H   Continuous Infusions: . dextrose 5 % and 0.45% NaCl 75 mL/hr at 03/28/14 0945   PRN Meds:.sodium chloride, acetaminophen, albuterol, morphine injection, sodium chloride     LOS: 7 days   Alexie Samson A. Merlene Laughter, M.D.  Diplomate, Tax adviser of Psychiatry and Neurology ( Neurology).

## 2014-03-28 NOTE — Plan of Care (Signed)
Problem: Phase I Progression Outcomes Goal: Dyspnea controlled at rest (HF) Outcome: Progressing Goal: Pain controlled with appropriate interventions Outcome: Completed/Met Date Met:  03/28/14

## 2014-03-28 NOTE — Progress Notes (Signed)
Pt moaning and groaning again this am. Paged Dr Roderic Palau.

## 2014-03-28 NOTE — Progress Notes (Signed)
[facility] PROGRESS NOTE  Corey Cain ZDG:[id number] DOB: 04-30-1915 DOA: 03/21/2014 PCP: No primary care provider on file.  Assessment/Plan: 1. Acute respiratory failure with hypoxia. Felt to be related to pneumonia, possibly acute diastolic congestive heart failure. Patient is on antibiotics and intravenous diuretics. Trying to wean down oxygen as tolerated. 2. Acute kidney injury on chronic kidney disease stage III. Nephrology following, appreciate input. Urinalysis and renal ultrasound ordered which were both unremarkable. He is continued on IV fluids and intravenous Lasix. Continue to monitor urine output. 3. Pneumonia. Currently on Rocephin and azithromycin. Afebrile. Underwent swallow study to rule out any underlying aspiration. 4. Acute diastolic congestive heart failure. Cardiology has seen the patient and has signed off. It was not felt that CHF was the cause of his respiratory problems. 5. Possible COPD. Does not appear to be wheezing and was started on intravenous steroids which was transitioned to prednisone. He has now completed his course of steroids. He does not have any wheezing on exam. 6. Right cerebral hemorrhage, intraventricular. Patient had sudden worsening of his mental status and associated left-sided weakness. CT scan of the head showed right cerebral hemorrhage, intraventricular. He was seen by neurology who felt that he would likely not survive this event and have recommended comfort care. The family has met with hospice services and is currently deciding if they wish the patient to be moved to a hospice home. I think comfort measures would be the most appropriate in this situation.. 7. Essential hypertension. Hydralazine was recently increased. Continue to follow. 8. Leukocytosis. Likely related to demargination from steroids. He is afebrile.  Code Status: DO NOT RESUSCITATE Family Communication: discussed with multiple family members Disposition Plan: Possible  discharge to residential hospice   Consultants:  Nephrology  Cardiology  Neurology  Procedures:    Antibiotics:  Rocephin 11/12>>  Azithromycin 11/12>>  HPI/Subjective: Patient does not provide any history. He moans in pain.  Objective: Filed Vitals:   03/28/14 1347  BP:   Pulse:   Temp:   Resp: 20    Intake/Output Summary (Last 24 hours) at 03/28/14 2036 Last data filed at 03/28/14 1900  Gross per 24 hour  Intake 1093.75 ml  Output      0 ml  Net 1093.75 ml   Filed Weights   03/26/14 0300 03/27/14 0500 03/28/14 0458  Weight: 78.7 kg (173 lb 8 oz) 78.064 kg (172 lb 1.6 oz) 77.82 kg (171 lb 9 oz)    Exam:   General:  Patient is less responsive. He does not answer questions or follow commands  Cardiovascular: s1, s2, rrr  Respiratory: rhonchi bilaterally  Abdomen: soft, nt, nd, bs+  Musculoskeletal: no edema b/l   Data Reviewed: Basic Metabolic Panel:  Recent Labs Lab 03/24/14 0517 03/25/14 0521 03/26/14 0633 03/26/14 0635 03/27/14 0543 03/28/14 0602  NA 141 144  --  141 139 138  K 3.8 3.4*  --  4.0 4.1 3.8  CL 100 99  --  96 95* 94*  CO2 26 30  --  $R'27 26 25  'bS$ GLUCOSE 91 116*  --  114* 98 94  BUN 77* 81*  --  83* 78* 75*  CREATININE 2.95* 3.09*  --  2.97* 2.76* 3.07*  CALCIUM 7.7* 7.7*  --  7.6* 7.3* 6.8*  PHOS  --   --  4.2  --   --   --    Liver Function Tests:  Recent Labs Lab 03/22/14 0521  AST 36  ALT 43  ALKPHOS 218*  BILITOT 0.2*  PROT 6.9  ALBUMIN 2.7*   No results for input(s): LIPASE, AMYLASE in the last 168 hours. No results for input(s): AMMONIA in the last 168 hours. CBC:  Recent Labs Lab 03/24/14 0517 03/25/14 0521 03/26/14 0635 03/27/14 0543 03/28/14 0602  WBC 17.3* 16.1* 15.9* 19.6* 14.0*  NEUTROABS 15.3* 14.4* 14.3* 17.7* 12.1*  HGB 10.3* 10.7* 11.0* 11.9* 10.0*  HCT 31.6* 34.1* 34.4* 35.8* 31.2*  MCV 78.4 78.4 77.0* 75.8* 75.9*  PLT 331 338 318 376 336   Cardiac Enzymes: No results for  input(s): CKTOTAL, CKMB, CKMBINDEX, TROPONINI in the last 168 hours. BNP (last 3 results)  Recent Labs  03/21/14 0228  PROBNP 2695.0*   CBG: No results for input(s): GLUCAP in the last 168 hours.  Recent Results (from the past 240 hour(s))  Culture, blood (routine x 2)     Status: None   Collection Time: 03/21/14  3:30 AM  Result Value Ref Range Status   Specimen Description BLOOD RIGHT ANTECUBITAL  Final   Special Requests BOTTLES DRAWN AEROBIC AND ANAEROBIC 10CC EACH  Final   Culture NO GROWTH 5 DAYS  Final   Report Status 03/26/2014 FINAL  Final  Culture, blood (routine x 2)     Status: None   Collection Time: 03/21/14  3:35 AM  Result Value Ref Range Status   Specimen Description BLOOD LEFT ANTECUBITAL  Final   Special Requests   Final    BOTTLES DRAWN AEROBIC AND ANAEROBIC AEB=4CC ANA=2CC   Culture NO GROWTH 5 DAYS  Final   Report Status 03/26/2014 FINAL  Final  MRSA PCR Screening     Status: None   Collection Time: 03/22/14 11:33 AM  Result Value Ref Range Status   MRSA by PCR NEGATIVE NEGATIVE Final    Comment:        The GeneXpert MRSA Assay (FDA approved for NASAL specimens only), is one component of a comprehensive MRSA colonization surveillance program. It is not intended to diagnose MRSA infection nor to guide or monitor treatment for MRSA infections.      Studies: Ct Head Wo Contrast  03/27/2014   CLINICAL DATA:  New left-sided weakness. Altered mental status began yesterday afternoon. Patient developed left upper lower extremity weakness this morning.  History of respiratory failure and acute injury to the kidneys. History of hypertension.  EXAM: CT HEAD WITHOUT CONTRAST  TECHNIQUE: Contiguous axial images were obtained from the base of the skull through the vertex without intravenous contrast.  COMPARISON:  None  FINDINGS: There is acute hemorrhage filling the right lateral ventricle, partly filling the third ventricle, with a small amount dependent  hemorrhage noted in the posterior left lateral ventricle. The source of this hemorrhage may be the right caudate nucleus head or anterior right thalamus. The right lateral ventricle is distended in relation to the left. Right lateral ventricle atrium measures 24 mm in greatest width with the left measuring 18 mm.  The remaining ventricles are enlarged, wall the sulci, reflecting moderate atrophy.  No parenchymal masses. No evidence of a recent cortical infarct. Patchy areas of white matter hypoattenuation are noted consistent with moderate chronic microvascular ischemic change. This extends to a small focus of the cortex of posterior right parietal lobe consistent with an old infarct.  No extra-axial masses.  Fluid mucosal thickening mostly opacifies the right maxillary sinus. There are dependent secretions of mild mucosal thickening in the left maxillary sinus. There is mild to moderate ethmoid sinus mucosal thickening. Right sphenoid sinus  is partly opacified by mucosal thickening. Mastoid air cells are clear.  IMPRESSION: 1. Acute intracranial hemorrhage. Hemorrhage fills the right lateral ventricle and extends into the third ventricle. Minimal dependent hemorrhage is noted in the left lateral ventricle. Hemorrhage is likely from either the right caudate nucleus head or anterior right thalamus, rupturing into the ventricular system. Critical Value/emergent results were called by telephone at the time of interpretation on 03/27/2014 at 4:48 pm to Dr. Jolaine Artist [facility] , who verbally acknowledged these results. 2. Right lateral ventricle was mildly dilated in relation to the left. There is no convincing diffuse hydrocephalus. 3. No other acute finding. Moderate generalized atrophy and chronic microvascular ischemic change. 4. Sinus disease as described above.   Electronically Signed   By: Lajean Manes M.D.   On: 03/27/2014 16:48    Scheduled Meds: . antiseptic oral rinse  7 mL Mouth Rinse BID  . atorvastatin  20  mg Oral q1800  . furosemide  40 mg Intravenous BID  . guaiFENesin  600 mg Oral BID  . hydrALAZINE  50 mg Oral 3 times per day  . ipratropium-albuterol  3 mL Nebulization TID  . loratadine  10 mg Oral Daily  . pantoprazole  40 mg Oral Daily  . piperacillin-tazobactam (ZOSYN)  IV  2.25 g Intravenous Q8H  . sertraline  50 mg Oral Daily  . sodium chloride  3 mL Intravenous Q12H  . sodium chloride  3 mL Intravenous Q12H   Continuous Infusions: . dextrose 5 % and 0.45% NaCl 75 mL/hr at 03/28/14 0945    Principal Problem:   Intracerebral hemorrhage, intraventricular Active Problems:   Acute respiratory failure with hypoxia   AKI (acute kidney injury)   CKD (chronic kidney disease), stage III   CAP (community acquired pneumonia)   Anemia   Leukocytosis   Aspiration pneumonia   Hypokalemia   Constipation     Time spent: 46mins    Corey Cain  [facility] Pager [phone]. If 7PM-7AM, please contact night-coverage at [email or web address], password [facility] 03/28/2014, 8:36 PM  LOS: 7 days

## 2014-03-28 NOTE — Progress Notes (Signed)
0300 nurse heard pt moaning and groaning from hallway. Nurse into pts room to ask if in any pain. Pt replied, "uh-huh" as a yes. Question repeated to be sure he said yes, and did. Nurse notified Dr Darrick Meigs regarding this, who ordered 1 mg Morphine X1. Will administer and will continue to monitor. Bed is in lowest position & call bell is within reach. Sitter is at bedside.   0420 Pt moaning and groaning again like before. Pt said he's in pain. Nurse paged Dr Darrick Meigs again, who ordered an additional 1 mg Morphine X1. Administering that now. Will continue to monitor.

## 2014-03-29 LAB — CBC WITH DIFFERENTIAL/PLATELET
BASOS ABS: 0 10*3/uL (ref 0.0–0.1)
Basophils Relative: 0 % (ref 0–1)
EOS ABS: 0.2 10*3/uL (ref 0.0–0.7)
Eosinophils Relative: 1 % (ref 0–5)
HCT: 29.7 % — ABNORMAL LOW (ref 39.0–52.0)
Hemoglobin: 9.8 g/dL — ABNORMAL LOW (ref 13.0–17.0)
LYMPHS ABS: 0.8 10*3/uL (ref 0.7–4.0)
Lymphocytes Relative: 5 % — ABNORMAL LOW (ref 12–46)
MCH: 25.1 pg — ABNORMAL LOW (ref 26.0–34.0)
MCHC: 33 g/dL (ref 30.0–36.0)
MCV: 76 fL — ABNORMAL LOW (ref 78.0–100.0)
Monocytes Absolute: 1 10*3/uL (ref 0.1–1.0)
Monocytes Relative: 7 % (ref 3–12)
NEUTROS PCT: 87 % — AB (ref 43–77)
Neutro Abs: 12.9 10*3/uL — ABNORMAL HIGH (ref 1.7–7.7)
PLATELETS: 256 10*3/uL (ref 150–400)
RBC: 3.91 MIL/uL — ABNORMAL LOW (ref 4.22–5.81)
RDW: 16.9 % — AB (ref 11.5–15.5)
WBC: 14.8 10*3/uL — AB (ref 4.0–10.5)

## 2014-03-29 LAB — BASIC METABOLIC PANEL
ANION GAP: 16 — AB (ref 5–15)
BUN: 70 mg/dL — ABNORMAL HIGH (ref 6–23)
CALCIUM: 6.6 mg/dL — AB (ref 8.4–10.5)
CO2: 25 mEq/L (ref 19–32)
Chloride: 95 mEq/L — ABNORMAL LOW (ref 96–112)
Creatinine, Ser: 3.02 mg/dL — ABNORMAL HIGH (ref 0.50–1.35)
GFR calc Af Amer: 19 mL/min — ABNORMAL LOW (ref >90)
GFR calc non Af Amer: 16 mL/min — ABNORMAL LOW (ref >90)
GLUCOSE: 139 mg/dL — AB (ref 70–99)
POTASSIUM: 3.6 meq/L — AB (ref 3.7–5.3)
SODIUM: 136 meq/L — AB (ref 137–147)

## 2014-03-29 NOTE — Progress Notes (Signed)
ANTIBIOTIC CONSULT NOTE  Pharmacy Consult for Zosyn Indication: pneumonia  No Known Allergies  Patient Measurements: Height: 5\' 6"  (167.6 cm) Weight: 172 lb 0.1 oz (78.02 kg) IBW/kg (Calculated) : 63.8  Vital Signs: Temp: 97.9 F (36.6 C) (11/20 0453) Temp Source: Axillary (11/20 0453) BP: 148/60 mmHg (11/20 0453) Pulse Rate: 95 (11/20 0453) Intake/Output from previous day: 11/19 0701 - 11/20 0700 In: 1093.8 [I.V.:693.8; IV Piggyback:400] Out: -  Intake/Output from this shift:    Labs:  Recent Labs  03/27/14 0543 03/28/14 0602 03/29/14 0638  WBC 19.6* 14.0* 14.8*  HGB 11.9* 10.0* 9.8*  PLT 376 336 256  CREATININE 2.76* 3.07* 3.02*   Estimated Creatinine Clearance: 13.7 mL/min (by C-G formula based on Cr of 3.02). No results for input(s): VANCOTROUGH, VANCOPEAK, VANCORANDOM, GENTTROUGH, GENTPEAK, GENTRANDOM, TOBRATROUGH, TOBRAPEAK, TOBRARND, AMIKACINPEAK, AMIKACINTROU, AMIKACIN in the last 72 hours.   Microbiology: Recent Results (from the past 720 hour(s))  Culture, blood (routine x 2)     Status: None   Collection Time: 03/21/14  3:30 AM  Result Value Ref Range Status   Specimen Description BLOOD RIGHT ANTECUBITAL  Final   Special Requests BOTTLES DRAWN AEROBIC AND ANAEROBIC 10CC EACH  Final   Culture NO GROWTH 5 DAYS  Final   Report Status 03/26/2014 FINAL  Final  Culture, blood (routine x 2)     Status: None   Collection Time: 03/21/14  3:35 AM  Result Value Ref Range Status   Specimen Description BLOOD LEFT ANTECUBITAL  Final   Special Requests   Final    BOTTLES DRAWN AEROBIC AND ANAEROBIC AEB=4CC ANA=2CC   Culture NO GROWTH 5 DAYS  Final   Report Status 03/26/2014 FINAL  Final  MRSA PCR Screening     Status: None   Collection Time: 03/22/14 11:33 AM  Result Value Ref Range Status   MRSA by PCR NEGATIVE NEGATIVE Final    Comment:        The GeneXpert MRSA Assay (FDA approved for NASAL specimens only), is one component of a comprehensive MRSA  colonization surveillance program. It is not intended to diagnose MRSA infection nor to guide or monitor treatment for MRSA infections.     Anti-infectives    Start     Dose/Rate Route Frequency Ordered Stop   03/24/14 1000  azithromycin (ZITHROMAX) tablet 500 mg  Status:  Discontinued     500 mg Oral Daily 03/23/14 1124 03/23/14 1415   03/23/14 1600  piperacillin-tazobactam (ZOSYN) IVPB 2.25 g     2.25 g100 mL/hr over 30 Minutes Intravenous Every 8 hours 03/23/14 1437     03/23/14 1600  vancomycin (VANCOCIN) IVPB 1000 mg/200 mL premix  Status:  Discontinued     1,000 mg200 mL/hr over 60 Minutes Intravenous Every 48 hours 03/23/14 1437 03/24/14 0947   03/22/14 0800  azithromycin (ZITHROMAX) 500 mg in dextrose 5 % 250 mL IVPB  Status:  Discontinued     500 mg250 mL/hr over 60 Minutes Intravenous Every 24 hours 03/21/14 0517 03/23/14 1124   03/21/14 0415  cefTRIAXone (ROCEPHIN) 1 g in dextrose 5 % 50 mL IVPB  Status:  Discontinued     1 g100 mL/hr over 30 Minutes Intravenous Daily 03/21/14 0402 03/23/14 1415   03/21/14 0400  cefTRIAXone (ROCEPHIN) 1 g in dextrose 5 % 50 mL IVPB  Status:  Discontinued     1 g100 mL/hr over 30 Minutes Intravenous Every 24 hours 03/21/14 0356 03/21/14 0400   03/21/14 0400  azithromycin (ZITHROMAX) 500 mg  in dextrose 5 % 250 mL IVPB  Status:  Discontinued     500 mg250 mL/hr over 60 Minutes Intravenous Every 24 hours 03/21/14 0356 03/21/14 0518   03/21/14 0345  cefTRIAXone (ROCEPHIN) 1 g in dextrose 5 % 50 mL IVPB  Status:  Discontinued     1 g100 mL/hr over 30 Minutes Intravenous  Once 03/21/14 0330 03/21/14 0400   03/21/14 0345  azithromycin (ZITHROMAX) 500 mg in dextrose 5 % 250 mL IVPB  Status:  Discontinued     500 mg250 mL/hr over 60 Minutes Intravenous Every 24 hours 03/21/14 0330 03/21/14 0356     Assessment: 18 yoM admitted with acute respiratory failure with hypoxia secondary to PNA vs HF on 03/21/14.  He has been on Rocephin & Zithromax since  admission, however CXR reveals worsening density in RUL concerning for aspiration PNA.  Antibiotic coverage being broadened.   He remains afebrile.  WBC remains elevated.  Afebrile.  SCr elevated. Acute on chronic renal insufficiency noted.  Cultures with no growth.  Vancomycin 11/14>>11/15 Zosyn 11/14>> Rocephin 11/12>>11/14 Zithromax 11/12>>11/14  Goal of Therapy:  Eradicate infection.  Plan:  Zosyn 2.25gm IV q8h Duration of therapy per MD Monitor renal function and cx data   Hart Robinsons A 03/29/2014,11:12 AM

## 2014-03-29 NOTE — Progress Notes (Signed)
Patient ID: Corey Cain, male   DOB: May 12, 1914, 80 y.o.   MRN: [id number]   Garner A. Merlene Laughter, MD     [email or web address]          Corey Cain is an 80 y.o. male.   Assessment/Plan: 1. R -thalamic hypertensive hemorrhage with a large ventricular extension. Fortunately for the patient and he has not developed expected acute hydrocephalus or extensive associated edema. His examination has stabilized. It appears that the patient therefore may not have massive herniated syndrome which is typically associated with death from acute hemorrhage. Ultimately, the patient may still succumb to complications of this hemorrhage but may be longer than expected. The case discussed extensively with the family.  No changes today.   Symmetric tremors likely due to acute medical illness/anxiety/medication effect.       GENERAL: In some discomfort but no acute distress.  HEENT: Supple. Atraumatic normocephalic.   ABDOMEN: soft  EXTREMITIES: No edema   BACK: Normal.  SKIN: Normal by inspection.   MENTAL STATUS: The patient lays in bed with eyes open. He does speak and 2 to 3 word sentences when prompted. He states he is doing okay. He does follow commands on R.  CRANIAL NERVES: Pupils are equal, round and reactive to light; he does vigorously resists eye opening; extra ocular movements seems are full - , there is no significant nystagmus; there does appears to be left-sided neglect. Facial muscle strength is symmetric.  MOTOR:  He follow commands with prompting on the right side but not on the left side. The right side is about 4 and the left approximately 3.  COORDINATION: There is constant to moderate amplitude moderate frequency rest and action tremor bilaterally. No cogwheeling or rigidity. No dysmetria.   REFLEXES: Deep tendon reflexes are symmetrical and normal.    The head CT scan is reviewed in person and also reviewed with the sun. There is a right  thalamic hemorrhage which extends to the entire right lateral ventricle and third ventricle. Hydrocephalus is not appreciated. 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 There is moderate to low atrophy.     Objective: Vital signs in last 24 hours: Temp:  [97.9 F (36.6 C)-98.5 F (36.9 C)] 98 F (36.7 C) (11/20 2000) Pulse Rate:  [90-108] 94 (11/20 2000) Resp:  [20-22] 20 (11/20 2000) BP: (135-158)/(60-108) 148/72 mmHg (11/20 2000) SpO2:  [93 %-99 %] 99 % (11/20 2000) Weight:  [78.02 kg (172 lb 0.1 oz)] 78.02 kg (172 lb 0.1 oz) (11/20 0453)  Intake/Output from previous day: 11/19 0701 - 11/20 0700 In: 1093.8 [I.V.:693.8; IV Piggyback:400] Out: -  Intake/Output this shift:   Nutritional status: DIET - DYS 1   Lab Results: Results for orders placed or performed during the hospital encounter of 03/21/14 (from the past 48 hour(s))  CBC WITH DIFFERENTIAL     Status: Abnormal   Collection Time: 03/28/14  6:02 AM  Result Value Ref Range   WBC 14.0 (H) 4.0 - 10.5 K/uL   RBC 4.11 (L) 4.22 - 5.81 MIL/uL   Hemoglobin 10.0 (L) 13.0 - 17.0 g/dL   HCT 31.2 (L) 39.0 - 52.0 %   MCV 75.9 (L) 78.0 - 100.0 fL   MCH 24.3 (L) 26.0 - 34.0 pg   MCHC 32.1 30.0 - 36.0 g/dL   RDW 16.8 (H) 11.5 - 15.5 %   Platelets 336 150 - 400 K/uL   Neutrophils Relative % 87 (H) 43 - 77 %   Neutro Abs 12.1 (H) 1.7 -  7.7 K/uL   Lymphocytes Relative 7 (L) 12 - 46 %   Lymphs Abs 1.0 0.7 - 4.0 K/uL   Monocytes Relative 6 3 - 12 %   Monocytes Absolute 0.8 0.1 - 1.0 K/uL   Eosinophils Relative 0 0 - 5 %   Eosinophils Absolute 0.1 0.0 - 0.7 K/uL   Basophils Relative 0 0 - 1 %   Basophils Absolute 0.0 0.0 - 0.1 K/uL  Basic metabolic panel     Status: Abnormal   Collection Time: 03/28/14  6:02 AM  Result Value Ref Range   Sodium 138 137 - 147 mEq/L   Potassium 3.8 3.7 - 5.3 mEq/L   Chloride 94 (L) 96 - 112 mEq/L   CO2 25 19 - 32 mEq/L   Glucose, Bld 94 70 - 99 mg/dL   BUN 75 (H) 6 - 23 mg/dL    Creatinine, Ser 3.07 (H) 0.50 - 1.35 mg/dL   Calcium 6.8 (L) 8.4 - 10.5 mg/dL   GFR calc non Af Amer 16 (L) >90 mL/min   GFR calc Af Amer 18 (L) >90 mL/min    Comment: (NOTE) The eGFR has been calculated using the CKD EPI equation. This calculation has not been validated in all clinical situations. eGFR's persistently <90 mL/min signify possible Chronic Kidney Disease.    Anion gap 19 (H) 5 - 15  CBC WITH DIFFERENTIAL     Status: Abnormal   Collection Time: 03/29/14  6:38 AM  Result Value Ref Range   WBC 14.8 (H) 4.0 - 10.5 K/uL   RBC 3.91 (L) 4.22 - 5.81 MIL/uL   Hemoglobin 9.8 (L) 13.0 - 17.0 g/dL   HCT 29.7 (L) 39.0 - 52.0 %   MCV 76.0 (L) 78.0 - 100.0 fL   MCH 25.1 (L) 26.0 - 34.0 pg   MCHC 33.0 30.0 - 36.0 g/dL   RDW 16.9 (H) 11.5 - 15.5 %   Platelets 256 150 - 400 K/uL    Comment: DELTA CHECK NOTED   Neutrophils Relative % 87 (H) 43 - 77 %   Neutro Abs 12.9 (H) 1.7 - 7.7 K/uL   Lymphocytes Relative 5 (L) 12 - 46 %   Lymphs Abs 0.8 0.7 - 4.0 K/uL   Monocytes Relative 7 3 - 12 %   Monocytes Absolute 1.0 0.1 - 1.0 K/uL   Eosinophils Relative 1 0 - 5 %   Eosinophils Absolute 0.2 0.0 - 0.7 K/uL   Basophils Relative 0 0 - 1 %   Basophils Absolute 0.0 0.0 - 0.1 K/uL  Basic metabolic panel     Status: Abnormal   Collection Time: 03/29/14  6:38 AM  Result Value Ref Range   Sodium 136 (L) 137 - 147 mEq/L   Potassium 3.6 (L) 3.7 - 5.3 mEq/L   Chloride 95 (L) 96 - 112 mEq/L   CO2 25 19 - 32 mEq/L   Glucose, Bld 139 (H) 70 - 99 mg/dL   BUN 70 (H) 6 - 23 mg/dL   Creatinine, Ser 3.02 (H) 0.50 - 1.35 mg/dL   Calcium 6.6 (L) 8.4 - 10.5 mg/dL   GFR calc non Af Amer 16 (L) >90 mL/min   GFR calc Af Amer 19 (L) >90 mL/min    Comment: (NOTE) The eGFR has been calculated using the CKD EPI equation. This calculation has not been validated in all clinical situations. eGFR's persistently <90 mL/min signify possible Chronic Kidney Disease.    Anion gap 16 (H) 5 - 15  Lipid  Panel No results for input(s): CHOL, TRIG, HDL, CHOLHDL, VLDL, LDLCALC in the last 72 hours.  Studies/Results:   Medications:  Scheduled Meds: . antiseptic oral rinse  7 mL Mouth Rinse BID  . atorvastatin  20 mg Oral q1800  . guaiFENesin  600 mg Oral BID  . hydrALAZINE  50 mg Oral 3 times per day  . ipratropium-albuterol  3 mL Nebulization TID  . loratadine  10 mg Oral Daily  . LORazepam  1 mg Intravenous Once  . pantoprazole  40 mg Oral Daily  . sertraline  50 mg Oral Daily  . sodium chloride  3 mL Intravenous Q12H  . sodium chloride  3 mL Intravenous Q12H   Continuous Infusions:   PRN Meds:.sodium chloride, acetaminophen, albuterol, morphine injection, sodium chloride     LOS: 8 days   Almalik Weissberg A. Merlene Laughter, M.D.  Diplomate, Tax adviser of Psychiatry and Neurology ( Neurology).

## 2014-03-29 NOTE — Progress Notes (Signed)
[facility] PROGRESS NOTE  Corey Cain FYB:[id number] DOB: [birth year] DOA: 03/21/2014 PCP: No primary care provider on file.  Assessment/Plan: 1. Acute respiratory failure with hypoxia. Felt to be related to pneumonia, possibly acute diastolic congestive heart failure. Patient is on antibiotics and intravenous diuretics. Trying to wean down oxygen as tolerated. 2. Acute kidney injury on chronic kidney disease stage III. Nephrology following, appreciate input. Urinalysis and renal ultrasound ordered which were both unremarkable. Creatinine appears to be stabilizing. We'll discontinue IV fluids and intravenous Lasix and monitor. 3. Pneumonia. Initially started on Rocephin and azithromycin, but then changed to Zosyn due to concerns of aspiration. He has completed an adequate course of antibiotics and Zosyn will be discontinued today.Marland Kitchen He is Afebrile. Underwent swallow study to rule out any underlying aspiration. 4. Acute diastolic congestive heart failure. Cardiology has seen the patient and has signed off. It was not felt that CHF was the cause of his respiratory problems. 5. Possible COPD. Does not appear to be wheezing and was started on intravenous steroids which was transitioned to prednisone. He has now completed his course of steroids. He does not have any wheezing on exam. 6. Right cerebral hemorrhage, intraventricular. Patient had sudden worsening of his mental status and associated left-sided weakness. CT scan of the head showed right cerebral hemorrhage, intraventricular. He was seen by neurology who initially felt that he would likely not survive this event and recommended comfort care. Since that time, patient's mental status appears to have stabilized. Family wishes to continue with current treatments. I have advised him that as if his mental status does start to decline, hospice would be most appropriate. 7. Essential hypertension. Hydralazine was recently increased. Continue to  follow. 8. Leukocytosis. Likely related to demargination from steroids. He is afebrile.  Code Status: DO NOT RESUSCITATE Family Communication: discussed with multiple family members Disposition Plan: Probable discharge to skilled nursing facility in a.m.   Consultants:  Nephrology  Cardiology  Neurology  Procedures:    Antibiotics:  Rocephin 11/12>> 11/14  Azithromycin 11/12>> 11/14  Zosyn 11/14>> 11/20  HPI/Subjective: Son reports that he had a choking episode earlier this morning. He feels that his mental status is improving.  Objective: Filed Vitals:   03/29/14 1507  BP: 158/65  Pulse: 90  Temp: 98.5 F (36.9 C)  Resp: 20    Intake/Output Summary (Last 24 hours) at 03/29/14 1927 Last data filed at 03/29/14 1553  Gross per 24 hour  Intake 1626.25 ml  Output     50 ml  Net 1576.25 ml   Filed Weights   03/27/14 0500 03/28/14 0458 03/29/14 0453  Weight: 78.064 kg (172 lb 1.6 oz) 77.82 kg (171 lb 9 oz) 78.02 kg (172 lb 0.1 oz)    Exam:   General: Patient wakes up to voice, answers questions, although he is still confused.  Cardiovascular: s1, s2, rrr  Respiratory: rhonchi bilaterally  Abdomen: soft, nt, nd, bs+  Musculoskeletal: no edema b/l   Data Reviewed: Basic Metabolic Panel:  Recent Labs Lab 03/25/14 0521 03/26/14 0633 03/26/14 0635 03/27/14 0543 03/28/14 0602 03/29/14 0638  NA 144  --  141 139 138 136*  K 3.4*  --  4.0 4.1 3.8 3.6*  CL 99  --  96 95* 94* 95*  CO2 30  --  27 26 25 25   GLUCOSE 116*  --  114* 98 94 139*  BUN 81*  --  83* 78* 75* 70*  CREATININE 3.09*  --  2.97* 2.76* 3.07* 3.02*  CALCIUM 7.7*  --  7.6* 7.3* 6.8* 6.6*  PHOS  --  4.2  --   --   --   --    Liver Function Tests: No results for input(s): AST, ALT, ALKPHOS, BILITOT, PROT, ALBUMIN in the last 168 hours. No results for input(s): LIPASE, AMYLASE in the last 168 hours. No results for input(s): AMMONIA in the last 168 hours. CBC:  Recent Labs Lab  03/25/14 0521 03/26/14 0635 03/27/14 0543 03/28/14 0602 03/29/14 0638  WBC 16.1* 15.9* 19.6* 14.0* 14.8*  NEUTROABS 14.4* 14.3* 17.7* 12.1* 12.9*  HGB 10.7* 11.0* 11.9* 10.0* 9.8*  HCT 34.1* 34.4* 35.8* 31.2* 29.7*  MCV 78.4 77.0* 75.8* 75.9* 76.0*  PLT 338 318 376 336 256   Cardiac Enzymes: No results for input(s): CKTOTAL, CKMB, CKMBINDEX, TROPONINI in the last 168 hours. BNP (last 3 results)  Recent Labs  03/21/14 0228  PROBNP 2695.0*   CBG: No results for input(s): GLUCAP in the last 168 hours.  Recent Results (from the past 240 hour(s))  Culture, blood (routine x 2)     Status: None   Collection Time: 03/21/14  3:30 AM  Result Value Ref Range Status   Specimen Description BLOOD RIGHT ANTECUBITAL  Final   Special Requests BOTTLES DRAWN AEROBIC AND ANAEROBIC 10CC EACH  Final   Culture NO GROWTH 5 DAYS  Final   Report Status 03/26/2014 FINAL  Final  Culture, blood (routine x 2)     Status: None   Collection Time: 03/21/14  3:35 AM  Result Value Ref Range Status   Specimen Description BLOOD LEFT ANTECUBITAL  Final   Special Requests   Final    BOTTLES DRAWN AEROBIC AND ANAEROBIC AEB=4CC ANA=2CC   Culture NO GROWTH 5 DAYS  Final   Report Status 03/26/2014 FINAL  Final  MRSA PCR Screening     Status: None   Collection Time: 03/22/14 11:33 AM  Result Value Ref Range Status   MRSA by PCR NEGATIVE NEGATIVE Final    Comment:        The GeneXpert MRSA Assay (FDA approved for NASAL specimens only), is one component of a comprehensive MRSA colonization surveillance program. It is not intended to diagnose MRSA infection nor to guide or monitor treatment for MRSA infections.      Studies: No results found.  Scheduled Meds: . antiseptic oral rinse  7 mL Mouth Rinse BID  . atorvastatin  20 mg Oral q1800  . furosemide  40 mg Intravenous BID  . guaiFENesin  600 mg Oral BID  . hydrALAZINE  50 mg Oral 3 times per day  . ipratropium-albuterol  3 mL Nebulization TID   . loratadine  10 mg Oral Daily  . LORazepam  1 mg Intravenous Once  . pantoprazole  40 mg Oral Daily  . piperacillin-tazobactam (ZOSYN)  IV  2.25 g Intravenous Q8H  . sertraline  50 mg Oral Daily  . sodium chloride  3 mL Intravenous Q12H  . sodium chloride  3 mL Intravenous Q12H   Continuous Infusions: . dextrose 5 % and 0.45% NaCl 75 mL/hr at 03/29/14 1333    Principal Problem:   Intracerebral hemorrhage, intraventricular Active Problems:   Acute respiratory failure with hypoxia   AKI (acute kidney injury)   CKD (chronic kidney disease), stage III   CAP (community acquired pneumonia)   Anemia   Leukocytosis   Aspiration pneumonia   Hypokalemia   Constipation     Time spent: 19mins    Eilene Voigt  [facility] Pager [phone]. If 7PM-7AM, please contact night-coverage at [email or web address], password [facility] 03/29/2014, 7:27 PM  LOS: 8 days

## 2014-03-29 NOTE — Clinical Social Work Note (Signed)
Updated [place] on pt. Anticipate d/c tomorrow to SNF. Facility agreeable.  Benay Pike, [place]

## 2014-03-29 NOTE — Clinical Social Work Note (Signed)
CSW met with patient's son.  Patient's son indicated that after speaking with family and the neurologist the family wants to wait on Hospice admission.  He indicated that he would prefer that [facility] go to [facility] to see if he improves and if he does not improve then the family would consider Hospice.    CSW advised Cindy at Hospice of family's plan to go to [facility].    CSW spoke with Keri at [facility] and advised of family's plan for patient be admitted to [facility] upon discharge.  CSW advised that the discharge could be this weekend. Keri was agreeable.  Heather Settle, LCSW [phone] 

## 2014-03-30 ENCOUNTER — Inpatient Hospital Stay
Admission: RE | Admit: 2014-03-30 | Discharge: 2014-05-10 | Disposition: E | Payer: Medicare FFS | Source: Ambulatory Visit | Attending: Internal Medicine | Admitting: Internal Medicine

## 2014-03-30 DIAGNOSIS — T17908A Unspecified foreign body in respiratory tract, part unspecified causing other injury, initial encounter: Secondary | ICD-10-CM

## 2014-03-30 DIAGNOSIS — J690 Pneumonitis due to inhalation of food and vomit: Principal | ICD-10-CM

## 2014-03-30 DIAGNOSIS — N179 Acute kidney failure, unspecified: Secondary | ICD-10-CM | POA: Insufficient documentation

## 2014-03-30 LAB — CBC WITH DIFFERENTIAL/PLATELET
Basophils Absolute: 0 10*3/uL (ref 0.0–0.1)
Basophils Relative: 0 % (ref 0–1)
EOS PCT: 0 % (ref 0–5)
Eosinophils Absolute: 0.1 10*3/uL (ref 0.0–0.7)
HCT: 32.9 % — ABNORMAL LOW (ref 39.0–52.0)
Hemoglobin: 10.7 g/dL — ABNORMAL LOW (ref 13.0–17.0)
LYMPHS ABS: 0.7 10*3/uL (ref 0.7–4.0)
LYMPHS PCT: 4 % — AB (ref 12–46)
MCH: 24.8 pg — ABNORMAL LOW (ref 26.0–34.0)
MCHC: 32.5 g/dL (ref 30.0–36.0)
MCV: 76.3 fL — AB (ref 78.0–100.0)
Monocytes Absolute: 1.1 10*3/uL — ABNORMAL HIGH (ref 0.1–1.0)
Monocytes Relative: 6 % (ref 3–12)
NEUTROS ABS: 17.4 10*3/uL — AB (ref 1.7–7.7)
Neutrophils Relative %: 90 % — ABNORMAL HIGH (ref 43–77)
PLATELETS: 269 10*3/uL (ref 150–400)
RBC: 4.31 MIL/uL (ref 4.22–5.81)
RDW: 16.8 % — AB (ref 11.5–15.5)
WBC: 19.3 10*3/uL — AB (ref 4.0–10.5)

## 2014-03-30 LAB — BASIC METABOLIC PANEL
Anion gap: 20 — ABNORMAL HIGH (ref 5–15)
BUN: 62 mg/dL — ABNORMAL HIGH (ref 6–23)
CHLORIDE: 93 meq/L — AB (ref 96–112)
CO2: 25 meq/L (ref 19–32)
Calcium: 7.1 mg/dL — ABNORMAL LOW (ref 8.4–10.5)
Creatinine, Ser: 2.68 mg/dL — ABNORMAL HIGH (ref 0.50–1.35)
GFR calc Af Amer: 21 mL/min — ABNORMAL LOW (ref >90)
GFR calc non Af Amer: 18 mL/min — ABNORMAL LOW (ref >90)
GLUCOSE: 138 mg/dL — AB (ref 70–99)
Potassium: 3.4 mEq/L — ABNORMAL LOW (ref 3.7–5.3)
SODIUM: 138 meq/L (ref 137–147)

## 2014-03-30 MED ORDER — METOPROLOL TARTRATE 25 MG PO TABS: 25.0000 mg | ORAL_TABLET | Freq: Two times a day (BID) | ORAL | Status: AC

## 2014-03-30 MED ORDER — IPRATROPIUM-ALBUTEROL 0.5-2.5 (3) MG/3ML IN SOLN: 3.0000 mL | Freq: Three times a day (TID) | RESPIRATORY_TRACT | Status: AC

## 2014-03-30 MED ORDER — GUAIFENESIN ER 600 MG PO TB12: 600.0000 mg | ORAL_TABLET | Freq: Two times a day (BID) | ORAL | Status: AC

## 2014-03-30 MED ORDER — METOPROLOL TARTRATE 25 MG PO TABS: 25.0000 mg | ORAL_TABLET | Freq: Two times a day (BID) | ORAL | Status: DC

## 2014-03-30 MED ORDER — MORPHINE SULFATE (CONCENTRATE) 10 MG /0.5 ML PO SOLN: 5.0000 mg | ORAL | Status: AC | PRN

## 2014-03-30 MED ORDER — POTASSIUM CHLORIDE CRYS ER 20 MEQ PO TBCR: 40.0000 meq | EXTENDED_RELEASE_TABLET | Freq: Once | ORAL | Status: DC

## 2014-03-30 MED ORDER — HYDRALAZINE HCL 50 MG PO TABS: 50.0000 mg | ORAL_TABLET | Freq: Three times a day (TID) | ORAL | Status: AC

## 2014-03-30 NOTE — Discharge Summary (Signed)
Physician Discharge Summary  Corey Cain LFY:[id number] DOB: May 08, 1915 DOA: 03/21/2014  PCP: No primary care provider on file.  Admit date: 03/21/2014 Discharge date: 04/06/2014  Time spent: 45 minutes  Recommendations for Outpatient Follow-up:  1. Patient will be discharged to [place] [facility] for further care 2. Consider repeat cbc and bmet in 1 week to ensure resolution of renal failure and leukocytosis 3. IF PATIENT BEGINS TO CLINICALLY DECLINE, WOULD TRANSITION PATIENT TO COMFORT MEASURES AND INVOLVE HOSPICE. FAMILY HAS ALREADY MET WITH HOSPICE AND ARE AGREEABLE FOR THIS PLAN.  Discharge Diagnoses:  Principal Problem:   Intracerebral hemorrhage, intraventricular Active Problems:   Acute respiratory failure with hypoxia   AKI (acute kidney injury)   CKD (chronic kidney disease), stage III   CAP (community acquired pneumonia)   Anemia   Leukocytosis   Aspiration pneumonia   Hypokalemia   Constipation   Acute kidney injury   Discharge Condition: stable  Diet recommendation: pureed diet with nectar thick liquids, only feed patient when he is fully awake and sitting up in bed  Filed Weights   03/28/14 0458 03/29/14 0453 03/11/2014 0614  Weight: 77.82 kg (171 lb 9 oz) 78.02 kg (172 lb 0.1 oz) 76.476 kg (168 lb 9.6 oz)    History of present illness:  This is a 80 year old gentleman who presents to the emergency room with complaints of shortness of breath, acute respiratory failure with hypoxia and renal dysfunction. Patient was having cough, wheezing at home for approximately 4-5 days prior to admission. Patient was felt to have a possible pneumonia on admission and was started on intravenous antibiotics. He was admitted for further treatments.  Hospital Course:  Acute respiratory failure with hypoxia. Patient was felt to have likely aspiration pneumonia. He was initially started on Rocephin and azithromycin, but this was changed to Zosyn. Patient completed a total of 7  days of antibiotic therapy. Due to significant wheezing and a possible history of COPD, he was given a course of corticosteroids. Patient's respiratory status has since stabilized. He is still requiring supplemental oxygen, but does not appear to have any increased work of breathing. Lung sounds are mostly clear with occasional rhonchi. He did have a swallow study done which indicated he does have penetration of his vocal cords but no frank aspiration. He is currently on nectar thick liquids with pured diet. See below for further deterioration in his swallowing. Patient is at risk for aspiration and should only be given food when he is fully awake, alert and sitting up. He will be continued on supplemental oxygen as well as bronchodilators for now.  Acute kidney injury. Patient's baseline creatinine was not entirely clear. He was seen by nephrologyfelt that he likely has underlying chronic kidney disease stage III. Creatinine appears to be near baseline, but can be further followed in the outpatient setting. His renal function appears to have stabilized. Would DC further ARB/hydrochlorothiazide which she was taking as an outpatient.  Diastolic congestive heart failure, acute on chronic. He was seen by cardiology and was transiently placed on Lasix. It was felt that his congestive heart failure was very mild and was not the cause of his respiratory symptoms that led to his hospitalization.  Nontraumatic, right cerebral hemorrhage, intraventricular. During his hospital course, patient had sudden worsening of his mental status and had associated left-sided weakness. CT scan of the head was performed which showed right cerebral hemorrhage, intraventricular. He was seen by neurology who initially felt that he would likely not survive this event  and recommended comfort care. The course of his hospitalization, patient's mental status appeared to stabilize. His right-sided weakness that improved. He is now awake, can  occasionally carry on a conversation. It was felt that he did not develop immediate complications of his bleed including herniation or hydrocephalus. Certainly, he is still at risk of developing long-term complications from his intracerebral bleed and may likely succumb to this. His family wishes for the patient to discharge to a skilled nursing facility for physical therapy. I have advised them strongly that if his overall clinical condition begins to decline, with his significant comorbidities and advanced age, the most appropriate course of action would be to pursue comfort care. Patient's family met with hospice during his hospital stay and are agreeable to pursue this plan. If the patient does continue to improve, they would eventually like to take him home, but understand that if his clinical condition begins to deteriorate, hospice would be the most appropriate course.  Leukocytosis. Patient does not have any evidence of fever and does not appear toxic. He does not have any clear source of infection at this time. He's completed an adequate course of antibiotics. His leukocytosis is present throughout his hospitalization. This can be further followed in the outpatient setting.  Procedures:    Consultations:  Nephrology  Cardiology  Neurology  Discharge Exam: Filed Vitals:   03/26/2014 0614  BP: 183/64  Pulse: 97  Temp: 97.5 F (36.4 C)  Resp: 20    General: NAD Cardiovascular: s1, s2, rrr Respiratory: cta b  Discharge Instructions You were cared for by a hospitalist during your hospital stay. If you have any questions about your discharge medications or the care you received while you were in the hospital after you are discharged, you can call the unit and asked to speak with the hospitalist on call if the hospitalist that took care of you is not available. Once you are discharged, your primary care physician will handle any further medical issues. Please note that NO REFILLS for  any discharge medications will be authorized once you are discharged, as it is imperative that you return to your primary care physician (or establish a relationship with a primary care physician if you do not have one) for your aftercare needs so that they can reassess your need for medications and monitor your lab values.  Discharge Instructions    Call MD for:  difficulty breathing, headache or visual disturbances    Complete by:  As directed      Call MD for:  severe uncontrolled pain    Complete by:  As directed      Call MD for:  temperature >100.4    Complete by:  As directed      Diet - low sodium heart healthy    Complete by:  As directed      Increase activity slowly    Complete by:  As directed           Current Discharge Medication List    START taking these medications   Details  guaiFENesin (MUCINEX) 600 MG 12 hr tablet Take 1 tablet (600 mg total) by mouth 2 (two) times daily.    hydrALAZINE (APRESOLINE) 50 MG tablet Take 1 tablet (50 mg total) by mouth every 8 (eight) hours.    ipratropium-albuterol (DUONEB) 0.5-2.5 (3) MG/3ML SOLN Take 3 mLs by nebulization 3 (three) times daily. Qty: 360 mL    metoprolol tartrate (LOPRESSOR) 25 MG tablet Take 1 tablet (25 mg total)  by mouth 2 (two) times daily.    Morphine Sulfate (MORPHINE CONCENTRATE) 10 mg / 0.5 ml concentrated solution Take 0.25-0.5 mLs (5-10 mg total) by mouth every 2 (two) hours as needed for severe pain. Qty: 120 mL, Refills: 0      CONTINUE these medications which have NOT CHANGED   Details  atorvastatin (LIPITOR) 20 MG tablet Take 20 mg by mouth daily.    benzonatate (TESSALON) 200 MG capsule Take 200 mg by mouth 3 (three) times daily as needed for cough.    loratadine (CLARITIN) 10 MG tablet Take 10 mg by mouth daily.    pantoprazole (PROTONIX) 40 MG tablet Take 40 mg by mouth daily.    sertraline (ZOLOFT) 50 MG tablet Take 50 mg by mouth daily.      STOP taking these medications     aspirin  81 MG tablet      losartan-hydrochlorothiazide (HYZAAR) 100-12.5 MG per tablet        No Known Allergies    The results of significant diagnostics from this hospitalization (including imaging, microbiology, ancillary and laboratory) are listed below for reference.    Significant Diagnostic Studies: Dg Chest 2 View  03/21/2014   CLINICAL DATA:  Cough for 1 week and congestion for 1 day. Shortness of breath and wheezing tonight.  EXAM: CHEST  2 VIEW  COMPARISON:  None.  FINDINGS: There is elevation of the right hemidiaphragm relative to the left. The patient has a small left pleural effusion with basilar atelectasis. The right lung is clear. No pneumothorax is identified. Heart size is upper normal. There is a fracture of the posterior arc of the left seventh rib which is age indeterminate.  IMPRESSION: Small left pleural effusion and basilar airspace disease, likely atelectasis.  Age indeterminate left seventh rib fracture.   Electronically Signed   By: Inge Rise M.D.   On: 03/21/2014 03:04   Ct Head Wo Contrast  03/27/2014   CLINICAL DATA:  New left-sided weakness. Altered mental status began yesterday afternoon. Patient developed left upper lower extremity weakness this morning.  History of respiratory failure and acute injury to the kidneys. History of hypertension.  EXAM: CT HEAD WITHOUT CONTRAST  TECHNIQUE: Contiguous axial images were obtained from the base of the skull through the vertex without intravenous contrast.  COMPARISON:  None  FINDINGS: There is acute hemorrhage filling the right lateral ventricle, partly filling the third ventricle, with a small amount dependent hemorrhage noted in the posterior left lateral ventricle. The source of this hemorrhage may be the right caudate nucleus head or anterior right thalamus. The right lateral ventricle is distended in relation to the left. Right lateral ventricle atrium measures 24 mm in greatest width with the left measuring 18 mm.   The remaining ventricles are enlarged, wall the sulci, reflecting moderate atrophy.  No parenchymal masses. No evidence of a recent cortical infarct. Patchy areas of white matter hypoattenuation are noted consistent with moderate chronic microvascular ischemic change. This extends to a small focus of the cortex of posterior right parietal lobe consistent with an old infarct.  No extra-axial masses.  Fluid mucosal thickening mostly opacifies the right maxillary sinus. There are dependent secretions of mild mucosal thickening in the left maxillary sinus. There is mild to moderate ethmoid sinus mucosal thickening. Right sphenoid sinus is partly opacified by mucosal thickening. Mastoid air cells are clear.  IMPRESSION: 1. Acute intracranial hemorrhage. Hemorrhage fills the right lateral ventricle and extends into the third ventricle. Minimal dependent hemorrhage  is noted in the left lateral ventricle. Hemorrhage is likely from either the right caudate nucleus head or anterior right thalamus, rupturing into the ventricular system. Critical Value/emergent results were called by telephone at the time of interpretation on 03/27/2014 at 4:48 pm to Dr. Jolaine Artist [facility] , who verbally acknowledged these results. 2. Right lateral ventricle was mildly dilated in relation to the left. There is no convincing diffuse hydrocephalus. 3. No other acute finding. Moderate generalized atrophy and chronic microvascular ischemic change. 4. Sinus disease as described above.   Electronically Signed   By: Lajean Manes M.D.   On: 03/27/2014 16:48   US Renal  03/22/2014   CLINICAL DATA:  Renal failure.  EXAM: RENAL/URINARY TRACT ULTRASOUND COMPLETE  COMPARISON:  None.  FINDINGS: Right Kidney:  The right kidney is suboptimally evaluated secondary to poor sonographic window and diffuse cortical echogenicity. The right kidney appears normal in size measuring 13.3 cm in length. Evaluation for hydronephrosis is degraded secondary to poor  sonographic window.  Left Kidney:  The left kidney is normal in size measuring 13.4 cm in length, however there is mild diffuse thinning of the left-sided renal cortical parenchyma with associated increased cortical echogenicity. Note is made of an exophytic slightly lobulated approximately 4.8 x 4.5 x 4.5 cm left-sided renal cyst. No evidence of left-sided urinary obstruction.  Bladder:  Appears normal for degree of bladder distention.  IMPRESSION: 1. Diffuse increased echogenicity of the bilateral renal parenchyma suggestive of medical renal disease. 2. No evidence of left-sided urinary obstruction. 3. Nondiagnostic evaluation for right-sided urinary obstruction secondary to poor sonographic window. Further evaluation could be performed with noncontrast abdominal CT as clinically indicated. 4. Incidentally noted approximately 4.8 cm partially exophytic left-sided renal cyst.   Electronically Signed   By: Sandi Mariscal M.D.   On: 03/22/2014 09:46   Dg Chest Port 1 View  03/26/2014   CLINICAL DATA:  ASPIRATION. PNEUMONIA. HX OF COLON CA. COLECTOMY-2005.HTN. NON SMOKER  EXAM: PORTABLE CHEST - 1 VIEW  COMPARISON:  03/24/2014  FINDINGS: Hazy airspace opacity in the right lower lung zone noted on the prior study is not as evident on the current exam. This suggests improvement although the difference could be technical due to less rotation to the right on the current exam. Lung volumes remain low, vertically on the right with there is elevation the right hemidiaphragm.  No new areas of lung opacity. No pulmonary edema. No convincing pleural effusion and no pneumothorax.  Cardiac silhouette is normal in size.  IMPRESSION: 1. Right lower lung zone opacity noted previously is less apparent consistent with improvement. No new abnormalities.   Electronically Signed   By: Lajean Manes M.D.   On: 03/26/2014 15:51   Dg Chest Port 1 View  03/24/2014   CLINICAL DATA:  Pneumonia.  Subsequent encounter.  EXAM: PORTABLE CHEST  - 1 VIEW  COMPARISON:  03/22/2014.  FINDINGS: There is more volume loss in the RIGHT hemithorax with elevation of the RIGHT hemidiaphragm. RIGHT midlung consolidation remains present although difficult to appreciate based on the lower lung volumes. Cardiomediastinal contours appear unchanged allowing for rotation of the RIGHT on today's examination. Monitoring leads project over the chest. LEFT lung appears clear aside from LEFT basilar atelectasis.  IMPRESSION: 1. Lower lung volumes on the RIGHT with elevation of the RIGHT hemidiaphragm. 2. Persistent airspace disease in the RIGHT mid lung, compatible with pneumonia. Asymmetric/atypical edema may also be present.   Electronically Signed   By: Arvilla Market.D.  On: 03/24/2014 12:50   Dg Chest Port 1 View  03/22/2014   CLINICAL DATA:  Pneumonia.  EXAM: PORTABLE CHEST - 1 VIEW  COMPARISON:  Two-view chest 03/21/2014.  FINDINGS: The heart is mildly enlarged. Interstitial edema has increased. Right upper lobe consolidation is developing. Elevation of the right hemidiaphragm is again noted. Bilateral pleural effusions are again noted. Bibasilar airspace disease likely reflects atelectasis.  The left seventh rib fracture appears old. It is unchanged. There is no pneumothorax. Atherosclerotic calcifications are noted at the aortic arch.  IMPRESSION: 1. Increasing density in the right upper lobe concerning for developing infection or aspiration. 2. Increasing edema and probable congestive heart failure. 3. Bilateral pleural effusions. 4. Bibasilar airspace disease likely reflects atelectasis.   Electronically Signed   By: Lawrence Santiago M.D.   On: 03/22/2014 14:14   Dg Swallowing Func-speech Pathology  03/26/2014   Ephraim Hamburger, CCC-SLP     03/26/2014  7:32 PM Objective Swallowing Evaluation: Modified Barium Swallowing Study   Patient Details  Name: Corey Cain MRN: [id number] Date of Birth: July 29, 1914  Today's Date: 03/26/2014 Time: 9024-0973 SLP Time  Calculation (min) (ACUTE ONLY): 26 min  Past Medical History:  Past Medical History  Diagnosis Date  . Cancer   . Colon cancer approx 2005    colon resection with resolution  . AKI (acute kidney injury) 03/21/2014  . Hypertension   . Hypercholesterolemia    Past Surgical History:  Past Surgical History  Procedure Laterality Date  . Colectomy  2005    Due to colon cancer   HPI:  Corey Cain  is a 80 y.o. year old male presenting with  Acute resp failure w/ hypoxia secondary to pneumonia and acute  diastolic CHF. He was admitted to telemetry and starte don IV  antibiotics empirically for pneumonia. Cultures are pending.  Echocardiogram done and on strict intake and output. Cardiac  enzymes are negative and EKG does not show any ischemic changes.  Echocardiogram shows grade 1 diastolic dysfunction and elevated  pulmonary artery pressures. Continue nasal canula oxygen to keep  sats>90%. Urine strep negative. ABg looks okay. Repeat CXR shows  increasing edema and congestive heart failure with increasing  density in the right upper lobe concerning for aspiration  pneumonia. He is on IV lasix for diuresis and antibiotics changed  to IV zosyn. Corey Cain son present in room for clinical  swallow evaluation and reports that his father has "a great  appetite and no difficulty swallowing". He consumes all meals at  home upright in a chair and masticates his medications if they  are larger per son.     Chest X-ray showing some improvement: IMPRESSION: 1. Right lower lung zone opacity noted previously is less  apparent consistent with improvement. No new abnormalities.  Assessment / Plan / Recommendation Clinical Impression  Dysphagia Diagnosis: Mild oral phase dysphagia;Mild pharyngeal  phase dysphagia;Suspected primary esophageal dysphagia Clinical impression: Corey Cain was much less alert today than  when seen yesterday in room for clinical swallow evaluation. Son  reports poor sleep since admittance to hospital.  Pt presents with  mild oropharyngeal phase dysphagia characterized by reduced  lingual movement and bolus cohesiveness, piecemeal deglutition,  reduced tongue base retraction, mildly decreased hyolaryngeal  excursion resulting in moderate vallecular residue most  pronounced with puree textures (clearance improves with  repeat/dry swallows) and flash penetration of thin liquids during  the swallow without witnessed aspiration. Pt did clear throat  several times during the study. Suspect  weakness and current  lethargy negatively impact oropharyngeal swallow at this time.  Recommend down grading textures to mechanical soft and  continuation of thin liquids. Slow rate with cues to repeat/dry  swallow throughout meals. Pt at risk for prandial and post  prandial aspiration due to significantly decreased esophageal  motility with delayed emptying of esophagus.     Treatment Recommendation  Therapy as outlined in treatment plan below    Diet Recommendation Dysphagia 3 (Mechanical Soft);Thin liquid   Liquid Administration via: Cup;Straw Medication Administration: Whole meds with liquid Supervision: Patient able to self feed;Intermittent supervision  to cue for compensatory strategies Compensations: Slow rate;Multiple dry swallows after each  bite/sip Postural Changes and/or Swallow Maneuvers: Seated upright 90  degrees;Upright 30-60 min after meal;Out of bed for meals    Other  Recommendations Other Recommendations: Clarify dietary  restrictions   Follow Up Recommendations  Skilled Nursing facility    Frequency and Duration min 2x/week  1 week   Pertinent Vitals/Pain VSS    SLP Swallow Goals   Pt will demonstrate safe and efficient consumption of least  restrictive diet with use of strategies as needed.   General Date of Onset: 03/21/14 HPI: Corey Cain  is a 80 y.o. year old male presenting  with Acute resp failure w/ hypoxia secondary to pneumonia and  acute diastolic CHF. He was admitted to telemetry and starte don   IV antibiotics empirically for pneumonia. Cultures are pending.  Echocardiogram done and on strict intake and output. Cardiac  enzymes are negative and EKG does not show any ischemic changes.  Echocardiogram shows grade 1 diastolic dysfunction and elevated  pulmonary artery pressures. Continue nasal canula oxygen to keep  sats>90%. Urine strep negative. ABg looks okay. Repeat CXR shows  increasing edema and congestive heart failure with increasing  density in the right upper lobe concerning for aspiration  pneumonia. He is on IV lasix for diuresis and antibiotics changed  to IV zosyn. Corey Cain son present in room for clinical  swallow evaluation and reports that his father has "a great  appetite and no difficulty swallowing". He consumes all meals at  home upright in a chair and masticates his medications if they  are larger per son.  Type of Study: Modified Barium Swallowing Study Reason for Referral: Objectively evaluate swallowing function Previous Swallow Assessment: None on record; son reports h/o  esophageal dilation in the past Diet Prior to this Study: Regular;Thin liquids Temperature Spikes Noted: No Respiratory Status: Nasal cannula History of Recent Intubation: No Behavior/Cognition: Cooperative;Pleasant mood;Lethargic Oral Cavity - Dentition: Adequate natural dentition Oral Motor / Sensory Function: Within functional limits Self-Feeding Abilities: Needs set up Patient Positioning: Upright in chair Baseline Vocal Quality: Low vocal intensity;Clear (can hear  congestion) Volitional Cough: Congested Volitional Swallow: Able to elicit Anatomy: Within functional limits (Pt leaning heavily to right  during exam) Pharyngeal Secretions: Not observed secondary MBS    Reason for Referral Objectively evaluate swallowing function   Oral Phase Oral Preparation/Oral Phase Oral Phase: Impaired Oral - Solids Oral - Puree: Delayed oral transit;Lingual/palatal  residue;Piecemeal swallowing;Reduced posterior  propulsion Oral - Regular: Reduced posterior propulsion;Lingual/palatal  residue;Piecemeal swallowing;Delayed oral transit Oral - Pill:  (Pt masticated pill despite request to swallow  whole) Oral Phase - Comment Oral Phase - Comment: Mild slowness with oral prep, weak AP  transit with decreased bolus cohesion   Pharyngeal Phase Pharyngeal Phase Pharyngeal Phase: Impaired Pharyngeal - Thin Pharyngeal - Thin Cup: Delayed swallow initiation;Premature  spillage to  valleculae;Pharyngeal residue -  valleculae;Penetration/Aspiration during swallow;Reduced anterior  laryngeal mobility;Reduced laryngeal elevation Penetration/Aspiration details (thin cup): Material does not  enter airway;Material enters airway, remains ABOVE vocal cords  then ejected out Pharyngeal - Thin Straw: Delayed swallow initiation;Premature  spillage to valleculae;Premature spillage to pyriform  sinuses;Reduced anterior laryngeal mobility;Reduced laryngeal  elevation;Penetration/Aspiration during swallow;Pharyngeal  residue - valleculae Penetration/Aspiration details (thin straw): Material does not  enter airway;Material enters airway, remains ABOVE vocal cords  then ejected out Pharyngeal - Solids Pharyngeal - Puree: Delayed swallow initiation;Premature spillage  to valleculae;Reduced anterior laryngeal mobility;Reduced  laryngeal elevation;Pharyngeal residue - valleculae;Reduced  tongue base retraction Pharyngeal - Regular: Delayed swallow initiation;Premature  spillage to valleculae;Reduced anterior laryngeal  mobility;Reduced tongue base retraction;Reduced laryngeal  elevation;Pharyngeal residue - valleculae Pharyngeal - Pill:  (pt masticated pill)  Cervical Esophageal Phase       Thank you,  Genene Churn, CCC-SLP [phone]  Cervical Esophageal Phase Cervical Esophageal Phase: Impaired Cervical Esophageal Phase - Comment Cervical Esophageal Comment: delayed emptying of esophagus;  barium retained throughout the study despite liquid wash;  reduced  motility         PORTER,DABNEY 03/26/2014, 7:31 PM     Microbiology: Recent Results (from the past 240 hour(s))  Culture, blood (routine x 2)     Status: None   Collection Time: 03/21/14  3:30 AM  Result Value Ref Range Status   Specimen Description BLOOD RIGHT ANTECUBITAL  Final   Special Requests BOTTLES DRAWN AEROBIC AND ANAEROBIC 10CC EACH  Final   Culture NO GROWTH 5 DAYS  Final   Report Status 03/26/2014 FINAL  Final  Culture, blood (routine x 2)     Status: None   Collection Time: 03/21/14  3:35 AM  Result Value Ref Range Status   Specimen Description BLOOD LEFT ANTECUBITAL  Final   Special Requests   Final    BOTTLES DRAWN AEROBIC AND ANAEROBIC AEB=4CC ANA=2CC   Culture NO GROWTH 5 DAYS  Final   Report Status 03/26/2014 FINAL  Final  MRSA PCR Screening     Status: None   Collection Time: 03/22/14 11:33 AM  Result Value Ref Range Status   MRSA by PCR NEGATIVE NEGATIVE Final    Comment:        The GeneXpert MRSA Assay (FDA approved for NASAL specimens only), is one component of a comprehensive MRSA colonization surveillance program. It is not intended to diagnose MRSA infection nor to guide or monitor treatment for MRSA infections.      Labs: Basic Metabolic Panel:  Recent Labs Lab 03/26/14 [phone] 03/26/14 9509 03/27/14 0543 03/28/14 0602 03/29/14 0638 03/10/2014 0841  NA  --  141 139 138 136* 138  K  --  4.0 4.1 3.8 3.6* 3.4*  CL  --  96 95* 94* 95* 93*  CO2  --  _0 GLUCOSE  --  114* 98 94 139* 138*  BUN  --  83* 78* 75* 70* 62*  CREATININE  --  2.97* 2.76* 3.07* 3.02* 2.68*  CALCIUM  --  7.6* 7.3* 6.8* 6.6* 7.1*  PHOS 4.2  --   --   --   --   --    Liver Function Tests: No results for input(s): AST, ALT, ALKPHOS, BILITOT, PROT, ALBUMIN in the last 168 hours. No results for input(s): LIPASE, AMYLASE in the last 168 hours. No results for input(s): AMMONIA in the last 168 hours. CBC:  Recent Labs Lab 03/26/14 [phone] 03/27/14 0543  03/28/14 0602  03/29/14 0638 03/31/2014 0841  WBC 15.9* 19.6* 14.0* 14.8* 19.3*  NEUTROABS 14.3* 17.7* 12.1* 12.9* 17.4*  HGB 11.0* 11.9* 10.0* 9.8* 10.7*  HCT 34.4* 35.8* 31.2* 29.7* 32.9*  MCV 77.0* 75.8* 75.9* 76.0* 76.3*  PLT 318 376 336 256 269   Cardiac Enzymes: No results for input(s): CKTOTAL, CKMB, CKMBINDEX, TROPONINI in the last 168 hours. BNP: BNP (last 3 results)  Recent Labs  03/21/14 0228  PROBNP 2695.0*   CBG: No results for input(s): GLUCAP in the last 168 hours.     Signed:  MEMON,JEHANZEB  [facility] 03/23/2014, 12:47 PM

## 2014-04-01 ENCOUNTER — Non-Acute Institutional Stay (SKILLED_NURSING_FACILITY): Payer: Medicare PPO | Admitting: Internal Medicine

## 2014-04-01 DIAGNOSIS — J690 Pneumonitis due to inhalation of food and vomit: Secondary | ICD-10-CM

## 2014-04-01 DIAGNOSIS — I611 Nontraumatic intracerebral hemorrhage in hemisphere, cortical: Secondary | ICD-10-CM

## 2014-04-01 DIAGNOSIS — D72829 Elevated white blood cell count, unspecified: Secondary | ICD-10-CM

## 2014-04-01 DIAGNOSIS — R339 Retention of urine, unspecified: Secondary | ICD-10-CM

## 2014-04-01 NOTE — Clinical Social Work Placement (Signed)
Clinical Social Work Department CLINICAL SOCIAL WORK PLACEMENT NOTE 04/01/2014  Patient:  KAYDENCE, BABA  Account Number:  [id number] Admit date:  03/21/2014  Clinical Social Worker:  Ambrose Pancoast, LCSW  Date/time:  03/26/2014 11:40 AM  Clinical Social Work is seeking post-discharge placement for this patient at the following level of care:   [place]   (*CSW will update this form in Epic as items are completed)   03/26/2014  Patient/family provided with [place] Department of Clinical Social Work's list of facilities offering this level of care within the geographic area requested by the patient (or if unable, by the patient's family).  03/26/2014  Patient/family informed of their freedom to choose among providers that offer the needed level of care, that participate in Medicare, Medicaid or managed care program needed by the patient, have an available bed and are willing to accept the patient.  03/26/2014  Patient/family informed of [facility]' ownership interest in [facility], as well as of the fact that they are under no obligation to receive care at this facility.  PASARR submitted to EDS on 03/26/2014 PASARR number received on 03/26/2014  FL2 transmitted to all facilities in geographic area requested by pt/family on  03/26/2014 FL2 transmitted to all facilities within larger geographic area on   Patient informed that his/her managed care company has contracts with or will negotiate with  certain facilities, including the following:     Patient/family informed of bed offers received:  03/26/2014 Patient chooses bed at [facility] Physician recommends and patient chooses bed at  [facility]  Patient to be transferred to [facility] on  03/10/2014 Patient to be transferred to facility by  Patient and family notified of transfer on 03/29/2014 Name of family member notified:    The following physician request were entered in  Epic:   Additional Comments: CSW advised patient's son that patient's [place] authorization can take up to 24 hours.  Patient's son gave CSW permission to fax clinicals to both [facility] and [facility].  Ambrose Pancoast, [place]

## 2014-04-01 NOTE — Progress Notes (Signed)
UR chart review completed.  

## 2014-04-02 ENCOUNTER — Other Ambulatory Visit: Payer: Self-pay

## 2014-04-02 MED ORDER — MORPHINE SULFATE (CONCENTRATE) 20 MG/ML PO SOLN: ORAL | Status: AC

## 2014-04-02 NOTE — Telephone Encounter (Signed)
RX faxed to [facility] @ [phone]. Phone number [phone]  

## 2014-04-03 ENCOUNTER — Encounter: Payer: Self-pay | Admitting: Internal Medicine

## 2014-04-03 ENCOUNTER — Non-Acute Institutional Stay (SKILLED_NURSING_FACILITY): Payer: Medicare PPO | Admitting: Internal Medicine

## 2014-04-03 DIAGNOSIS — J690 Pneumonitis due to inhalation of food and vomit: Secondary | ICD-10-CM

## 2014-04-03 DIAGNOSIS — D72829 Elevated white blood cell count, unspecified: Secondary | ICD-10-CM

## 2014-04-03 DIAGNOSIS — I5030 Unspecified diastolic (congestive) heart failure: Secondary | ICD-10-CM | POA: Insufficient documentation

## 2014-04-03 DIAGNOSIS — N183 Chronic kidney disease, stage 3 unspecified: Secondary | ICD-10-CM

## 2014-04-03 DIAGNOSIS — I5032 Chronic diastolic (congestive) heart failure: Secondary | ICD-10-CM

## 2014-04-03 HISTORY — DX: Unspecified diastolic (congestive) heart failure: I50.30

## 2014-04-03 NOTE — Progress Notes (Signed)
Patient ID: Corey Cain, male   DOB: [birth year], 80 y.o.   MRN: [id number]               HISTORY & PHYSICAL  DATE:  04/01/2014    FACILITY: [facility]    LEVEL OF CARE:   SNF   CHIEF COMPLAINT: Admission to SNF, post stay at [facility], 03/21/2014 through 03/14/2014.     HISTORY OF PRESENT ILLNESS: This is a 80 year-old man with perhaps mild dementia who was living at home with around-the-clock standby care from sitters and also his family.  He presented to the hospital initially with complaints of shortness of breath, acute respiratory failure with hypoxia, and acute renal failure. He was apparently coughing and wheezing at home for 4-5 days prior to admission. He was admitted due to this. He was started on treatment for aspiration pneumonia with Rocephin and azithromycin, changed to Zosyn. He was felt to have possible COPD and was given a course of steroids. He did have a swallowing study done which indicated that he does have penetration of his vocal cords, but no frank aspiration. He was discharged on nectar-thick with pureed diet.     He was also felt to have chronic kidney disease, although the history here was lacking, stage III. It would appear that his creatinine was in the upper 2s to low 3s. He was discharged with a creatinine of 2.68, CO2 of 25, and a potassium of 3.4.     Also notable is that he had a white count of 19.3 and 90% neutrophils. Interestingly, I do not see a normal white count during this entire hospitalization, presenting with a white count of 14.1. It would appear that it fluctuated somewhat and then had gone strictly upwards.     Finally during this hospital course, he had a sudden worsening of his mental status and left-sided weakness. CT scan showed a right cerebral hemorrhage, in particular. He was seen by Neurology, who did not think he was going to survive. However, he did improve and he began conversing again.     PAST MEDICAL HISTORY/PROBLEM  LIST:   Acute respiratory failure with hypoxia. Felt to be secondary to aspiration pneumonia.   Acute-on-chronic renal failure. Felt to have stage III chronic renal failure.   Community-acquired pneumonia.   Anemia.   Persistent leukocytosis of unclear etiology.   Aspiration pneumonia.   Hypokalemia.   Constipation.   Left hip fracture remotely.   History of prostate cancer, although I see no information on this.    CURRENT MEDICATIONS: Discharge medications include:   Guaifenesin 600 b.i.d.   Hydralazine 50 mg q.8.   DuoNebs three times daily.   Lopressor 25 b.i.d.   Morphine concentrate 5-10 mg every 2 hours p.r.n.   Lipitor 20 q.d.   Tessalon 200 mg daily.   Claritin 10 q.d.   Protonix 40 q.d.   Zoloft 50 q.d.    SOCIAL HISTORY:  FUNCTIONAL STATUS: As noted above, he was listed as having around-the-clock care as described by his family. There also was a Actuary, who is in the room. In spite of this, they state he was independent with ADLs at home and was continent. They did list him as having mild dementia. Apparently, he wandered away from the home a year ago which prompted the around-the-clock care.    FAMILY HISTORY: None provided at this time.     REVIEW OF SYSTEMS: Not possible from the patient. However, he did eat a  good breakfast according to the people who are present, and was talking in full sentences yesterday.     PHYSICAL EXAMINATION:  VITAL SIGNS:  O2 SATURATIONS: 95% on room air.  PULSE: 91.  RESPIRATIONS: 20.  GENERAL APPEARANCE: The patient is not in any distress.  CHEST/RESPIRATORY: Crackles in the right lower lobe. Left lung is clear, although shallow.  CARDIOVASCULAR:  CARDIAC: Heart sounds are normal. He does not appear to be dehydrated. There are no signs of heart failure.  GASTROINTESTINAL:  ABDOMEN: He has a midline abdominal scar of uncertain etiology.  HERNIA: He has a surgical hernia at the superior aspect of this scar.    LIVER/SPLEEN/KIDNEYS: No liver, no spleen.  GENITOURINARY:  BLADDER: His bladder feels full.  RECTAL: Exam revealed OB-positive stool.  PROSTATE: Not enlarged.  NEUROLOGICAL:  CRANIAL NERVES: He appears to have possible left hemianopsia with left hemineglect.  SENSATION/STRENGTH: He has weakness of the left arm and left leg. As mentioned, there is tremendous left-sided neglect here. I do not think that my exam would be that accurate.  OTHER: He can respond to speech. Mostly did not say anything when I asked him his name, his age, where he was born. He seemed to voice continuously that he needed to "pee-pee".      ASSESSMENT/PLAN:   I suspect this man is recurrently aspirating in spite of his reasonably benign swallowing test quoted above. I suspect his white count is probably secondary to this. He did not have a urine culture. However, he did have blood cultures which were negative.   Intracerebral hemorrhage on the right. He has some left-sided weakness, left hemineglect, ?left hemianopsia.   ?Urinary retention. I think he is probably going to need an in-and-out cath.    Background dementia, by description of the family.  He did require around-the-clock care at home, but was reasonably independent with ADLs.     Hypokalemia.  His potassium today is 3.     Severe chronic renal failure with a BUN of 63 and a creatinine of 2.36.  This actually seems improved versus his baseline.    I think this patient's prognosis is strictly guarded.  He did eat a good meal.  However, I suspect there is aspiration pneumonitis.    He may also have a urinary tract infection with some degree of urinary retention and urinary frequency.  He will need a repeat chest x-ray, urine culture.  I will probably start him on an antibiotic empirically for the possibility of a UTI.    There are quite clear feelings from the hospital that this man should be made comfort care and the family did meet with Hospice.   He is  a DNR.      CPT CODE: 13086

## 2014-04-03 NOTE — Progress Notes (Signed)
Patient ID: Corey Cain, male   DOB: Jul 02, 1914, 80 y.o.   MRN: [id number]   .   Patient ID: Corey Cain, male   DOB: [birth year], 80 y.o.   MRN: [id number]                 FACILITY: [facility]    LEVEL OF CARE:   SNF  This is an acute visit   CHIEF COMPLAINT:  Acute visit follow-up leukocytosis hypokalemia --? weight gain   HISTORY OF PRESENT ILLNESS: This is a 80 year-old man with perhaps mild dementia who was living at home with around-the-clock standby care from sitters and also his family.  He presented to the hospital initially with complaints of shortness of breath, acute respiratory failure with hypoxia, and acute renal failure. He was apparently coughing and wheezing at home for 4-5 days prior to admission. He was admitted due to this. He was started on treatment for aspiration pneumonia with Rocephin and azithromycin, changed to Zosyn. He was felt to have possible COPD and was given a course of steroids. He did have a swallowing study done which indicated that he does have penetration of his vocal cords, but no frank aspiration. He was discharged on nectar-thick with pureed diet.     He was also felt to have chronic kidney disease, although the history here was lacking, stage III. It would appear that his creatinine was in the upper 2s to low 3s. He was discharged with a creatinine of 2.68, CO2 of 25, and a potassium of 3.4.     Also notable is that he had a white count of 19.3 and 90% neutrophils. As noted by Dr. Dellia Nims previously, I do not see a normal white count during this entire hospitalization, presenting with a white count of 14.1. It would appear that it fluctuated somewhat and then had gone strictly upwards.     Finally during this hospital course, he had a sudden worsening of his mental status and left-sided weakness. CT scan showed a right cerebral hemorrhage, in particular. He was seen by Neurology, who did not think he was going to survive. However, he  did improve and he began conversing again He continues to converse somewhat although is quite confused-he has actually eating quite well according to his family was at bedside this evening.  We have done update labs which shows a white count of 17.3 which is slightly up from the lab earlier this week.--- Earlier this week Dr. Dellia Nims did empirically start him on Cipro for concerns possibly a UTI-it appears urine culture results have come back negative.  Dr. Dellia Nims also thought there may be an element of aspiration pneumonitis here with chronic aspiration-  He was also noted to have a potassium of 3 earlier this week Dr. Dellia Nims did start him on 20 mEq of potassium twice a day with potassium is now up to 4.0  Another issue is possible weight gain it appears his baseline weight on admission on the 21st was 166-this is been relatively stable however weight done today is 169.8-his vital signs are stable he is a poor historian but does not appear to have any increase shortness of breath  Her chart review he does have a history of grade 1 diastolic dysfunction he was evaluated by cardiology in the hospital which apparently at one point gave him a short course of Lasix--but did not really think his respiratory issues were caused by the CHF .     PAST MEDICAL HISTORY/PROBLEM  LIST:   Acute respiratory failure with hypoxia. Felt to be secondary to aspiration pneumonia.   Acute-on-chronic renal failure. Felt to have stage III chronic renal failure.   Community-acquired pneumonia.   Anemia.   Persistent leukocytosis of unclear etiology.   Aspiration pneumonia.   Hypokalemia.   Constipation.   Left hip fracture remotely.   History of prostate cancer, although I see no information on this.    CURRENT MEDICATIONS: Discharge medications include:   Guaifenesin 600 b.i.d.   Hydralazine 50 mg q.8.   DuoNebs three times daily.   Lopressor 25 b.i.d.   Morphine concentrate 5-10 mg every 2 hours  p.r.n.   Lipitor 20 q.d.   Tessalon 200 mg daily.   Claritin 10 q.d.   Protonix 40 q.d.   Zoloft 50 q.d.    SOCIAL HISTORY:  FUNCTIONAL STATUS: As noted above, he was listed as having around-the-clock care as described by his family. There also was a Actuary, who is in the room. In spite of this, they state he was independent with ADLs at home and was continent. They did list him as having mild dementia. Apparently, he wandered away from the home a year ago which prompted the around-the-clock care.    FAMILY HISTORY: None provided at this time.     REVIEW OF SYSTEMS: Not possible from the patient. However he did have a good appetite this evening according to his daughter who is at bedside y.     PHYSICAL EXAMINATION:  VITAL SIGNS:  Temperature 98.6 pulse 99 respirations 20 blood pressure 129/62 weight is 169.8 .  GENERAL APPEARANCE: The patient is not in any distress comfortably in bed. Skin is warm and dry.    CHEST/RESPIRATORY: From what I could hear generally clear to auscultation although respiratory effort was poor he does have shallow air entry.  CARDIOVASCULAR:  CARDIAC: Heart sounds are normal. He does not appear to be dehydrated. There is mild lower extremity edema I would say trace.  GASTROINTESTINAL:  ABDOMEN: He has a midline abdominal scar of uncertain etiology.  HERNIA: He has a surgical hernia at the superior aspect of this scar Bowel l sounds are positive abdomen does not appear to be acutely tender he does have some reaction to the invasive maneuver but this does not appear to be acute tenderness.     NEUROLOGICAL:  CRANIAL NERVES: He appears to have possible left hemianopsia with left hemineglect.  SENSATION/STRENGTH: He has weakness of the left arm and left leg. Somewhat difficult to fully assess since patient does not really follow verbal commands   Labs.  04/03/2014.  WBC 17.3 hemoglobin 9.7 platelets 317 granulocytes of 15.6.  Sodium 145 potassium 4  BUN 80 creatinine 2.79.  Alk phosphatase 312-AST 48-ALT 66.  Albumin 2.2 . .      ASSESSMENT/PLAN:   Leukocytosis-unclear etiology-apparently this was an issue in the hospital as well-blood cultures apparently were negative-urine culture done in the facility appears to be negative as well-he has been placed on them. Cipro for now-secondary to Dr. Janalyn Rouse assessment of suspicions of chronic aspiration will order a chest x-ray-clinically he does not appear to be unstable although a fragile individual  .   Intracerebral hemorrhage on the right. He has some left-sided weakness, left hemineglect..      .     Hypokalemia.  His potassium today is4.0--we'll continue potassium supplementation 20 mEq twice a day and recheck this on Friday--this was discussed with Dr. Dellia Nims via phone  .  Severe chronic renal failure--he appears relatively to be at his baseline with a BUN of 80 creatinine of 2.79-creatinine is up somewhat from previous creatinine of 2.36-- baseline apparently is in the mid 2-3 area-will update this later in the week as well.  History of elevated liver function tests-it appears this was the case in the hospital as well with elevated alkaline phosphatase in the 200+ range--and AST and ALTborderline  to mildly elevated- will update this later in the week as well-physical exam did not really elicit significant abdominal tenderness but will have to be monitored  Possible weight gain-again this is a 1 day variation-he appears to have fairly trace edema-again will order the chest x-ray --also monitors vitall signs and pulse ox every shift and check lung sounds every shift as well  Also continue daily weights notify provider of any continued weight gain -again he does not appear to be in any type of respiratory distress certainly at this time but will have to be monitored closely   CPT-99309   .      CPT CODE: 67591

## 2014-04-04 ENCOUNTER — Inpatient Hospital Stay (HOSPITAL_COMMUNITY)
Admit: 2014-04-04 | Discharge: 2014-04-04 | Disposition: A | Discharge status: Home / Self Care | Payer: Medicare FFS | Attending: Internal Medicine | Admitting: Internal Medicine

## 2014-04-05 ENCOUNTER — Non-Acute Institutional Stay (SKILLED_NURSING_FACILITY): Payer: Medicare PPO | Admitting: Internal Medicine

## 2014-04-05 ENCOUNTER — Encounter: Payer: Self-pay | Admitting: Internal Medicine

## 2014-04-05 DIAGNOSIS — E876 Hypokalemia: Secondary | ICD-10-CM

## 2014-04-05 DIAGNOSIS — D72829 Elevated white blood cell count, unspecified: Secondary | ICD-10-CM

## 2014-04-05 DIAGNOSIS — J189 Pneumonia, unspecified organism: Secondary | ICD-10-CM

## 2014-04-05 DIAGNOSIS — E870 Hyperosmolality and hypernatremia: Secondary | ICD-10-CM

## 2014-04-05 DIAGNOSIS — J690 Pneumonitis due to inhalation of food and vomit: Secondary | ICD-10-CM

## 2014-04-05 NOTE — Progress Notes (Signed)
Patient ID: Corey Cain, male   DOB: May 22, 1914, 80 y.o.   MRN: [id number] Patient ID: Corey Cain, male   DOB: 1915/02/18, 80 y.o.   MRN: [id number]   .   Patient ID: Corey Cain, male   DOB: Jun 17, 1914, 80 y.o.   MRN: [id number]                 FACILITY: [facility]    LEVEL OF CARE:   SNF  This is an acute visit   CHIEF COMPLAINT:  Acute visit follow-up leukocytosis --chest x-ray indicating developing pneumonia   HISTORY OF PRESENT ILLNESS: This is a 80 year-old man with perhaps mild dementia who was living at home with around-the-clock standby care from sitters and also his family.  He presented to the hospital initially with complaints of shortness of breath, acute respiratory failure with hypoxia, and acute renal failure. He was apparently coughing and wheezing at home for 4-5 days prior to admission. He was admitted due to this. He was started on treatment for aspiration pneumonia with Rocephin and azithromycin, changed to Zosyn. He was felt to have possible COPD and was given a course of steroids. He did have a swallowing study done which indicated that he does have penetration of his vocal cords, but no frank aspiration. He was discharged on nectar-thick with pureed diet.     He was also felt to have chronic kidney disease, although the history here was lacking, stage III. It would appear that his creatinine was in the upper 2s to low 3s. He was discharged with a creatinine of 2.68, CO2 of 25, and a potassium of 3.4.     Also notable is that he had a white count of 19.3 and 90% neutrophils. As noted by Dr. Dellia Nims previously, I do not see a normal white count during this entire hospitalization, presenting with a white count of 14.1. It would appear that it fluctuated somewhat and then had gone strictly upwards--left work today shows an white count of 17,000 which is been his baseline the past week.     Finally during this hospital course, he had a sudden worsening of  his mental status and left-sided weakness. CT scan showed a right cerebral hemorrhage, in particular. He was seen by Neurology, who did not think he was going to survive. However, he did improve and he began conversing again He continues to converse somewhat although is quite confused-he has actually eating quite well according to his family was at bedside this evening.   Dr. Dellia Nims did start him on a course of Cipro for possible UTI-however urine culture has come back negative-chest x-ray also was ordered however we just show a developing right lower lobe infiltrate with bilateral pleural effusions  He was also noted to have a potassium of 3 earlier this week Dr. Dellia Nims did start him on 20 mEq of potassium twice a day with potassium is now up to 5.1  Another issue is possible weight gain it appears his baseline weight on admission on the 21st was 166-this is been relatively stable however weight done earlier this weekwas 169.8-however it is now 167.6 so I suspect that was more of a scale variation  Per chart review he does have a history of grade 1 diastolic dysfunction he was evaluated by cardiology in the hospital which apparently at one point gave him a short course of Lasix--but did not really think his respiratory issues were caused by the CHF  Today he appears to be  stable-according to family which is very supportive he is eating relatively well-I do note on lab his sodium is 149 fluids will have to be encouraged strongly.  Again I also notice potassium is 5.1 Willa reduce his potassium supplementation.  Vital signs continued to be stable .     PAST MEDICAL HISTORY/PROBLEM LIST:   Acute respiratory failure with hypoxia. Felt to be secondary to aspiration pneumonia.   Acute-on-chronic renal failure. Felt to have stage III chronic renal failure.   Community-acquired pneumonia.   Anemia.   Persistent leukocytosis of unclear etiology.   Aspiration pneumonia.   Hypokalemia.    Constipation.   Left hip fracture remotely.   History of prostate cancer, although I see no information on this.    CURRENT MEDICATIONS: Discharge medications include:   Guaifenesin 600 b.i.d.   Hydralazine 50 mg q.8.   DuoNebs three times daily.   Lopressor 25 b.i.d.   Morphine concentrate 5-10 mg every 2 hours p.r.n.   Lipitor 20 q.d.   Tessalon 200 mg daily.   Claritin 10 q.d.   Protonix 40 q.d.   Zoloft 50 q.d  Potassium 20 mEq twice a day.    SOCIAL HISTORY:  FUNCTIONAL STATUS: As noted above, he was listed as having around-the-clock care as described by his family. There also was a Actuary, who is in the room. In spite of this, they state he was independent with ADLs at home and was continent. They did list him as having mild dementia. Apparently, he wandered away from the home a year ago which prompted the around-the-clock care.    FAMILY HISTORY: None provided at this time.     REVIEW OF SYSTEMS: Not possible from the patient. However per discussion with family at bedside he is eating fairly well with their support-they have not noted any increased complaints of shortness of breath or cough nursing staff does not report any increased coughing or shortness of breath- y.     PHYSICAL EXAMINATION:  VITAL SIGNS:  Temperature 97.5 pulse 99 respirations 20 blood pressure 111/88 weight is 167.6 this appears to be stable .  GENERAL APPEARANCE: The patient is not in any distress sitting comfortably in his wheelchair Skin is warm and dry.    CHEST/RESPIRATORY: From what I could hear generally clear to auscultation although respiratory effort was poor he does have shallow air entry.  CARDIOVASCULAR:  CARDIAC: Heart sounds are normal. He does not appear to be dehydrated. There is mild lower extremity edema I would say trace.  GASTROINTESTINAL:  ABDOMEN: He has a midline abdominal scar of uncertain etiology.  HERNIA: He has a surgical hernia at the superior aspect of  this scar Bowel l sounds are positive abdomen does not appear to be acutely tender he does have some reaction to the invasive maneuver but this does not appear to be acute tenderness.     NEUROLOGICAL:  CRANIAL NERVES: He appears to have possible left hemianopsia with left hemineglect.  SENSATION/STRENGTH: He has weakness of the left arm and left leg. Somewhat difficult to fully assess since patient does not really follow verbal commands     Labs.  04/05/2014.  WBC 17.0 hemoglobin 9.6 platelets 308.  Sodium 149 potassium 5.1 BUN 81 creatinine 2.86.  Alk phosphatase 360 AST 59 ALT 68 albumin 2.2  04/03/2014.  WBC 17.3 hemoglobin 9.7 platelets 317 granulocytes of 15.6.  Sodium 145 potassium 4 BUN 80 creatinine 2.79.  Alk phosphatase 312-AST 48-ALT 66  .  Albumin 2.2 . Marland Kitchen  ASSESSMENT/PLAN:   Leukocytosis-unclear etiology-apparently this was an issue in the hospital as well-blood cultures apparently were negative-urine culture done in the facility appears to be negative as well Chest x-ray has come back indicative of a possible developing right lower lobe infiltrate-we will discontinue the ciprofloxacin and start Avelox for a seven-day course-also monitor vital signs pulse ox every shift-and update a CBC with differential and metabolic panel next laboratory day.     .   Intracerebral hemorrhage on the right. He has some left-sided weakness, left hemineglect..      .     Hypokalemia.  His potassium today is 5.1-at this point will hold his potassium since it appears to be rising fairly rapidly we will have to update this  next   Laboratory day however    .    Severe chronic renal failure--he appears relatively to be at his baseline with a BUN of 80 creatinine of 2..86 BUN of 81--creatinine is up somewhat from previous creatinine of 2.36-- baseline apparently is in the mid 2-3 area-will update this next lab day as well  Hypernatremia-this appears to be slowly  rising with a sodium of 149-will write order to encourage fluids quite strongly-patient does have a history of suspected CHF which has been stable again this is a challenging balancing situation-with his renal issues.  History of elevated liver function tests-it appears this was the case in the hospital as well with elevated alkaline phosphatase in the 200+ range--and AST and ALTborderline  to mildly elevated- he does not appear to be overtly symptomatic continue to monitor    Possible weight gain-this appears to be of been more of a scale variation weight is stable in the mid high 160s       [id number]   .

## 2014-04-07 ENCOUNTER — Non-Acute Institutional Stay (SKILLED_NURSING_FACILITY): Payer: Medicare PPO | Admitting: Internal Medicine

## 2014-04-07 ENCOUNTER — Encounter: Payer: Self-pay | Admitting: Internal Medicine

## 2014-04-07 DIAGNOSIS — F411 Generalized anxiety disorder: Secondary | ICD-10-CM

## 2014-04-07 DIAGNOSIS — R635 Abnormal weight gain: Secondary | ICD-10-CM

## 2014-04-07 DIAGNOSIS — J690 Pneumonitis due to inhalation of food and vomit: Secondary | ICD-10-CM

## 2014-04-07 NOTE — Progress Notes (Signed)
Patient ID: Jawon Perras, male   DOB: 03/14/1915, 80 y.o.   MRN: [id number] Patient ID: Magnum Jarboe, male   DOB: 12/14/1914, 80 y.o.   MRN: [id number] Patient ID: Kent Kalata, male   DOB: 03/28/1915, 80 y.o.   MRN: [id number]   .   Patient ID: Keven Weyandt, male   DOB: 09/10/1914, 80 y.o.   MRN: [id number]                 FACILITY: [facility]    LEVEL OF CARE:   SNF  This is an acute visit   CHIEF COMPLAINT:  Acute visit follow-up weight gain-pneumonia-anxiety   HISTORY OF PRESENT ILLNESS: This is a 80 year-old man with perhaps mild dementia who was living at home with around-the-clock standby care from sitters and also his family.  He presented to the hospital initially with complaints of shortness of breath, acute respiratory failure with hypoxia, and acute renal failure. He was apparently coughing and wheezing at home for 4-5 days prior to admission. He was admitted due to this. He was started on treatment for aspiration pneumonia with Rocephin and azithromycin, changed to Zosyn. He was felt to have possible COPD and was given a course of steroids. He did have a swallowing study done which indicated that he does have penetration of his vocal cords, but no frank aspiration. He was discharged on nectar-thick with pureed diet.--He actually is been eating quite well-nursing staff has noted weight gain of approximately 4 pounds in the past week-this appears tohcover mainly in the higher 160s-he does not have increased edema-however chest x-ray did show possibly a developing right lower lobe infiltrate and he is been started on Avelox.  His oxygen saturations continued to be in the 90s he is on 2 L of oxygen-clinically he appears to be at his baseline I do not appreciate any increased edema--he continues on his next as well as nebulizers as well.       He was also felt to have chronic kidney disease, although the history here was lacking, stage III. It would appear that his  creatinine was in the upper 2s to low 3s. He was discharged with a creatinine of 2.68, CO2 of 25, and a potassium of 3.4--. His lab done on November 27 showed a sodium of 149 potassium 5.1 BUN 81 creatinine 2.86-     Also notable is that he had a white count of 19.3 and 90% neutrophils. As noted by Dr. Robson previously, I do not see a normal white count during this entire hospitalization, presenting with a white count of 14.1. It would appear that it fluctuated somewhat and then had gone strictly upwards--left work on 11-27 shows an white count of 17,000 which is been his baseline the past week.     Finally during this hospital course, he had a sudden worsening of his mental status and left-sided weakness. CT scan showed a right cerebral hemorrhage, in particular. He was seen by Neurology, who did not think he was going to survive. However, he did improve and he began conversing again He is eating very well with strong family support-however they have noticed he has significant anxiety causing about embed appears to be anxious-apparently during previous hospitalizations he has not done well with Xanax apparently this made him even more agitated-however family nursing staff are concerned about this agitation and if possible if something else could be tried.       In regards to other issues  Per chart   review he does have a history of grade 1 diastolic dysfunction he was evaluated by cardiology in the hospital which apparently at one point gave him a short course of Lasix--but did not really think his respiratory issues were caused by the CHF     Vital signs continued to be stable .     PAST MEDICAL HISTORY/PROBLEM LIST:   Acute respiratory failure with hypoxia. Felt to be secondary to aspiration pneumonia.   Acute-on-chronic renal failure. Felt to have stage III chronic renal failure.   Community-acquired pneumonia.   Anemia.   Persistent leukocytosis of unclear etiology.    Aspiration pneumonia.   Hypokalemia.   Constipation.   Left hip fracture remotely.   History of prostate cancer, although I see no information on this.    CURRENT MEDICATIONS: Discharge medications include:   Guaifenesin 600 b.i.d.   Hydralazine 50 mg q.8.   DuoNebs three times daily.   Lopressor 25 b.i.d.   Morphine concentrate 5-10 mg every 2 hours p.r.n.   Lipitor 20 q.d.   Tessalon 200 mg daily.   Claritin 10 q.d.   Protonix 40 q.d.   Zoloft 50 q.d .    SOCIAL HISTORY:  FUNCTIONAL STATUS: As noted above, he was listed as having around-the-clock care as described by his family. There also was a Actuary, who is in the room. In spite of this, they state he was independent with ADLs at home and was continent. They did list him as having mild dementia. Apparently, he wandered away from the home a year ago which prompted the around-the-clock care.    FAMILY HISTORY: None provided at this time.     REVIEW OF SYSTEMS: Not possible from the patient. However per discussion with family at bedside he is eating fairly well with their support-they have not noted any increased complaints of shortness of breath or cough nursing staff does not report any increased coughing or shortness of breath- However patient does continue to be apparently persistently anxious agitated appears restless y.     PHYSICAL EXAMINATION:  VITAL SIGNS:  T 98.4 pulse 97 respirations 20 blood pressure 146/56 O2 saturation has been in the high 90s on 2 L oxygen-weight today is 170.4 it was 167.6 on the 27th-166.2 on the 22nd-there is some variation here .  GENERAL APPEARANCE: The patient is not in any distress lying in bed he does appear somewhat restless however  Skin is warm and dry.    CHEST/RESPIRATORY: From what I could hear generally clear to auscultation although respiratory effort was poor he does have shallow air entry.  CARDIOVASCULAR:  CARDIAC: Heart sounds are normal. He does not appear  to be dehydrated. There is mild lower extremity edema I would say trace.--This is relatively unchanged from previous exam  GASTROINTESTINAL:  ABDOMEN: He has a midline abdominal scar of uncertain etiology.  HERNIA: He has a surgical hernia at the superior aspect of this scar Bowel l sounds are positive abdomen does not appear to be acutely tender he does have some reaction to the invasive maneuver but this does not appear to be acute tenderness.     NEUROLOGICAL:  CRANIAL NERVES: He appears to have possible left hemianopsia with left hemineglect.  SENSATION/STRENGTH: He has weakness of the left arm and left leg. Somewhat difficult to fully assess since patient does not really follow verbal commands   psych-again he does appear somewhat anxious does verbalize and will answer questions although very short-says he is not having pain-pher family frequently  tosses his legs to the side of the bed and is fidgety       Labs.  04/05/2014.  WBC 17.0 hemoglobin 9.6 platelets 308.  Sodium 149 potassium 5.1 BUN 81 creatinine 2.86.  Alk phosphatase 360 AST 59 ALT 68 albumin 2.2  04/03/2014.  WBC 17.3 hemoglobin 9.7 platelets 317 granulocytes of 15.6.  Sodium 145 potassium 4 BUN 80 creatinine 2.79.  Alk phosphatase 312-AST 48-ALT 66  .  Albumin 2.2 . .      ASSESSMENT/PLAN:   Leukocytosis-unclear etiology-apparently this was an issue in the hospital as well-blood cultures apparently were negative-urine culture done in the facility appears to be negative as well Chest x-ray has come back indicative of a possible developing right lower lobe infiltrate- He has been started on on Avelox continue nebulizers as well as Mucinex at this point clinically appears stable.  O2 sats rations in the high 90s-his family believes the nasal cannula is making patient more anxious as well-respiratory status appears stable at this time we'll do a trial  course off oxygen certainly this will have to be  monitored however.     .   Intracerebral hemorrhage on the right. He has some left-sided weakness, left hemineglect. This has been baseline recently.      .     Hypokalemia.  His potassium on 04-05-14 is 5.1-at this point potassium is being held update a metabolic panel pending for tomorrow      .    Severe chronic renal failure--he appears relatively to be at his baseline with a BUN of 80 creatinine of 2..86 BUN of 81--creatinine is up somewhat from previous creatinine of 2.36-- baseline apparently is in the mid 2-3 area-updated lab is pending  Hypernatremia-this appears to be slowly rising with a sodium of 149-have written an order to encourage fluids quite strongly-patient does have a history of suspected CHF which has been stable again this is a challenging balancing situation-with his renal issues.  History of elevated liver function tests-it appears this was the case in the hospital as well with elevated alkaline phosphatase in the 200+ range--and AST and ALTborderline  to mildly elevated- he does not appear to be overtly symptomatic continue to monitor    Possible weight gain-there appears to be some variability here if scales  are to be believed he has gained about 4 pounds the past week-again he is eating quite well-at this point cautious about starting any diuretic with his renal issues including elevated sodium-O2 sats uations are satisfactory-he does not complain of any shortness of breath-at this point will continue to monitor weights closely-again a metabolic panel is pending for tomorrow  Anxiety-agitation-this was discussed with Dr. Robson via phone-patient does not do well with Xanax as noted above-Will start low-dose Haldol 0.5 mg every 8 hours when necessary and monitor-this was discussed with his son at bedside  [id number]         .                                       

## 2014-04-09 ENCOUNTER — Ambulatory Visit (HOSPITAL_COMMUNITY): Payer: Medicare FFS | Attending: Internal Medicine

## 2014-04-09 ENCOUNTER — Non-Acute Institutional Stay (SKILLED_NURSING_FACILITY): Payer: Medicare PPO | Admitting: Internal Medicine

## 2014-04-09 DIAGNOSIS — D72829 Elevated white blood cell count, unspecified: Secondary | ICD-10-CM

## 2014-04-09 DIAGNOSIS — J690 Pneumonitis due to inhalation of food and vomit: Secondary | ICD-10-CM | POA: Insufficient documentation

## 2014-04-09 DIAGNOSIS — N183 Chronic kidney disease, stage 3 unspecified: Secondary | ICD-10-CM

## 2014-04-09 DIAGNOSIS — E876 Hypokalemia: Secondary | ICD-10-CM

## 2014-04-09 DEATH — deceased

## 2014-04-10 ENCOUNTER — Non-Acute Institutional Stay (SKILLED_NURSING_FACILITY): Payer: Medicare PPO | Admitting: Internal Medicine

## 2014-04-10 DIAGNOSIS — J690 Pneumonitis due to inhalation of food and vomit: Secondary | ICD-10-CM

## 2014-04-10 DIAGNOSIS — I611 Nontraumatic intracerebral hemorrhage in hemisphere, cortical: Secondary | ICD-10-CM

## 2014-04-10 DIAGNOSIS — E870 Hyperosmolality and hypernatremia: Secondary | ICD-10-CM

## 2014-04-10 DIAGNOSIS — D72829 Elevated white blood cell count, unspecified: Secondary | ICD-10-CM

## 2014-04-15 NOTE — Progress Notes (Addendum)
Patient ID: Corey Cain, male   DOB: 06/29/1914, 80 y.o.   MRN: [id number]               PROGRESS NOTE  DATE:  04/26/2014    FACILITY: [facility]    LEVEL OF CARE:   SNF   Acute Visit   CHIEF COMPLAINT:  Follow-up of multiple medical issues since yesterday.    HISTORY OF PRESENT ILLNESS:  Corey Cain is a 80 year-old man who arrived here after a stay at [facility] from 03/21/2014 through 03/29/2014.    He was admitted with aspiration pneumonia.  He was treated with antibiotics.  He was felt to have COPD.    He also has stage IV chronic renal failure.  His creatinine is in the 3 range, which appears to be baseline for him.  His estimated GFR is 16.    Also during the hospitalization, he was noted to have a right intercerebral hemorrhage.  He has continued to have left-sided neglect and left-sided weakness.    Other medical issues that we have been following includes:    Acute-on-chronic renal failure with hypernatremia with a sodium that got as high as 158.  I have given him several liters of one-half normal saline.  His sodium is down to 151.  BUN is 87, creatinine at 3.04.    Declining hemoglobin down to 8.9.  I did a rectal exam on him today.  He has intensely guaiac-positive stool.    Right upper lobe pneumonia showing on a chest x-ray from yesterday.  He is on Avelox and clindamycin.    Persistent leukocytosis of uncertain etiology.  That was true in the hospital and has been true here.  His white count today is 14.1 with 94% neutrophils.    Elevated liver function tests.  A comprehensive metabolic panel from today shows an alk phos of 392, AST of 65, ALT of 72.  This is a new issue.  His alk phos was slightly elevated in the hospital, as well.  Severe protein calorie malnutrition.  His albumin level is 2.2, although he has been reported to be eating everything on his plate.    Urinary retention.  This was evident when he first came into the building.  He has  a history of prostate cancer, although he does not have obvious prostate enlargement on rectal examination.  We catheterized him for well over 500 cc.    CURRENT MEDICATIONS:  Medication list is reviewed.     Atorvastatin 20 q.d.    Avelox 400 q.d.    Benzonatate 200 mg three times a day p.r.n. cough.   Claritin 10 q.d.    Clindamycin 450 q.8 h.    Haldol 0.5 every 8 hours p.r.n.    Hydralazine 50 q.8.    DuoNebs three times a day.    Metoprolol 25 twice a day.    Roxanol p.r.n.    Mucinex 600 b.i.d.    Omeprazole 20 q.d.    Zoloft 50 q.d.    LABORATORY DATA:   Lab work from today as noted above.    Sodium 151, potassium 4.7, BUN 87, creatinine 3.04.    Alk phos 392, AST 65, ALT 72.    Albumin 2.2.    Calcium 7.3.    CBC:  White count 14.1, hemoglobin 8.9, granulocytes 94%.    REVIEW OF SYSTEMS:  Really not possible from the patient.  However, according to his family, he is eating and drinking well, pureed  with nectar-thick.  He was restless and agitated all night long.     PHYSICAL EXAMINATION:   VITAL SIGNS:   TEMPERATURE:  98.6.   PULSE:  95.   RESPIRATIONS:  30.   BLOOD PRESSURE:  150/60.   O2 SATURATIONS:  This morning, 87% on 2 L, although it is now 95% on 2 L.   WEIGHT:   176.8, which is up 5 pounds from yesterday.   GENERAL APPEARANCE:  The patient is awake, restless, agitated.  Speech is not very coherent.   CHEST/RESPIRATORY:  There are bibasilar crackles.   CARDIOVASCULAR:  CARDIAC:  Heart sounds are distant.  There are no murmurs.  He appears to be euvolemic, which is an improvement from yesterday.   GASTROINTESTINAL:  ABDOMEN:   Distended.   HERNIA:  He has a large ventral hernia, although I do not think that is that active.   LIVER/SPLEEN/KIDNEYS:  No liver, no spleen are palpable.   GENITOURINARY:  BLADDER:   ?Suprapubic tenderness, but no costovertebral angle tenderness.   RECTAL:  Prostate is not enlarged.  He has intensely  guaiac-positive stool.   NEUROLOGICAL:   Not an easy assessment.   SENSATION/STRENGTH:  He is able to move his left arm and left leg.  I continue to think there is significant left hemineglect.   PSYCHIATRIC:   MENTAL STATUS:   Certainly coexistent delirium is evident.    ASSESSMENT/PLAN:                           Right upper lobe pneumonia.  He is on Avelox and clindamycin.  I will leave this active for now.    Leukocytosis of uncertain etiology.  This may be a reflection of recurrent aspiration pneumonitis, right upper lobe pneumonia, or perhaps we have a biliary tract issue now with elevated liver function tests.  He is not having diarrhea.  I suspect he is recrrently aspirating.   Hypernatremia with severe chronic renal failure (stage IV).  This is coming down.  Although I am hearing he drinks the nectar-thick liquids, I still think this is an intake issue.  His urine does not look to be that dilute (I have no reason to believe that this is a diabetes insipidus issue).    Severe protein calorie malnutrition, although he is apparently eating well.    Late-effect right intercerebral hemorrhage.  As I have explained to the family, the fact that this is on the last line of a very active medical problem list suggests this man is not going to do well.  I will try to have another talk with one of his children.  I would like to make him comfort care in the facility, or at least limit his hospitalizations.  He is already a DNR.

## 2014-05-10 NOTE — Progress Notes (Addendum)
Patient ID: Corey Cain, male   DOB: 1914-06-09, 81 y.o.   MRN: [id number]               PROGRESS NOTE  DATE:  04/09/2014    FACILITY: [facility]    LEVEL OF CARE:   SNF   Acute Visit   CHIEF COMPLAINT:  Follow up hypernatremia, persistent leukocytosis.    HISTORY OF PRESENT ILLNESS:  This is a 81 year-old man whom I admitted to this facility on 04/01/2014.  At that point, he had been in [facility] for nine days, admitted with what was felt to be aspiration pneumonia on the background of underlying COPD.  He had a swallowing study which indicated that he did have penetration of his vocal cords, but no frank aspiration.  He was discharged on a nectar-thick with pureed diet.    He was also noted to have chronic kidney disease with a creatinine around the 3 range.  His discharge creatinine was 2.68.    He also had a persistently high white count during this hospitalization, discharged with a white count of 19.3 and 90% neutrophils.     Finally during the hospitalization, he had a sudden worsening of his mental status and left-sided weakness.  CT scan showed a right intracerebral hemorrhage.     When I admitted this man, I noted urinary retention.  We put a catheter in him for over 500 cc.    I thought this man was likely recurrently aspirating in spite of a benign swallowing test.  Finally, I wondered whether the persistent white count was related to recurrent aspiration.    On 04/05/2014, his sodium was found to be 149.  This was repeated on 04/08/2014 at 158.  His BUN and creatinine went from 81 and 2.86 to 88 and 3.07, respectively, reflecting worsening.  He has been on half-normal saline since then and this will need to be repeated tomorrow.    Also from 04/05/2014, I noted his alk phos was 360.  I will get a GGT on him.    He continues to have a persistently high white count with left shift.  I thought when he came in that this was likely related to recurrent  aspiration pneumonitis.  He has been on Avelox since 04/05/2014 when a chest x-ray from 04/04/2014 showed developing infiltrate.    REVIEW OF SYSTEMS:  Not possible from the patient.  However, family reports that he is more confused today than they have seen him recently.  He was apparently able to feed himself breakfast this morning.  They state he will drink the nectar-thick liquids, although they complain he is "not getting enough".    PHYSICAL EXAMINATION:   VITAL SIGNS:   O2 SATURATIONS:   95%.   RESPIRATIONS:  26.   PULSE:   82.   TEMPERATURE:  97.8 this morning.   BLOOD PRESSURE:  148/62.   GENERAL APPEARANCE:  The patient is restless, agitated.   CHEST/RESPIRATORY:  Coarse crackles and probably bronchial breathing in the right lower lobe.  Left lung is clear.   CARDIOVASCULAR:  CARDIAC:   Heart sounds are normal.  His JVP is not elevated.   EDEMA/VARICOSITIES:  He does not have any lower extremity edema, but he does have 2-3+ pitting coccyx edema.   GASTROINTESTINAL:  ABDOMEN:   Mildly distended.  He has bowel sounds that go right up into his chest.    HERNIA:  I suspect he probably has a significant hiatal  hernia.   He has a previous ventral abdominal hernia noted.   NEUROLOGICAL:   He is restless and agitated, picking at his belt buckle.    ASSESSMENT/PLAN:                       Right lower lobe pneumonia.  He is on Avelox.  I am going to repeat his chest x-ray.  I am going to add something for additional coverage, possibly clindamycin.  I suspect recurrent aspiration pneumonitis   Severe protein calorie malnutrition on arrival with an albumin of 2.2.  This seems better.    Elevated alk phos.  I am going to order a GGT for tomorrow and consider this as a possible source of his white count, although there does not appear to be a lot of findings on abdominal exam.    Severe hypernatremia with a sodium of 158.  I will recheck this tomorrow.  I do not see that there is any evidence  that he is losing fluid excessively.  I would be left with the thought that he is simply not drinking enough of his "nectar-thickened liquids".    Severe chronic renal failure, at baseline.  I do not see this man as a dialysis candidate.    Delirium.  Multifactorial.

## 2014-05-10 DEATH — deceased

## 2015-12-18 IMAGING — CR DG CHEST 2V
2 series · 2 of 2 positions shown · non-contrast
Comparison: None.

CLINICAL DATA: Cough for 1 week and congestion for 1 day. Shortness
of breath and wheezing tonight.

EXAM:
CHEST  2 VIEW

[view not recorded (1 of 2)]
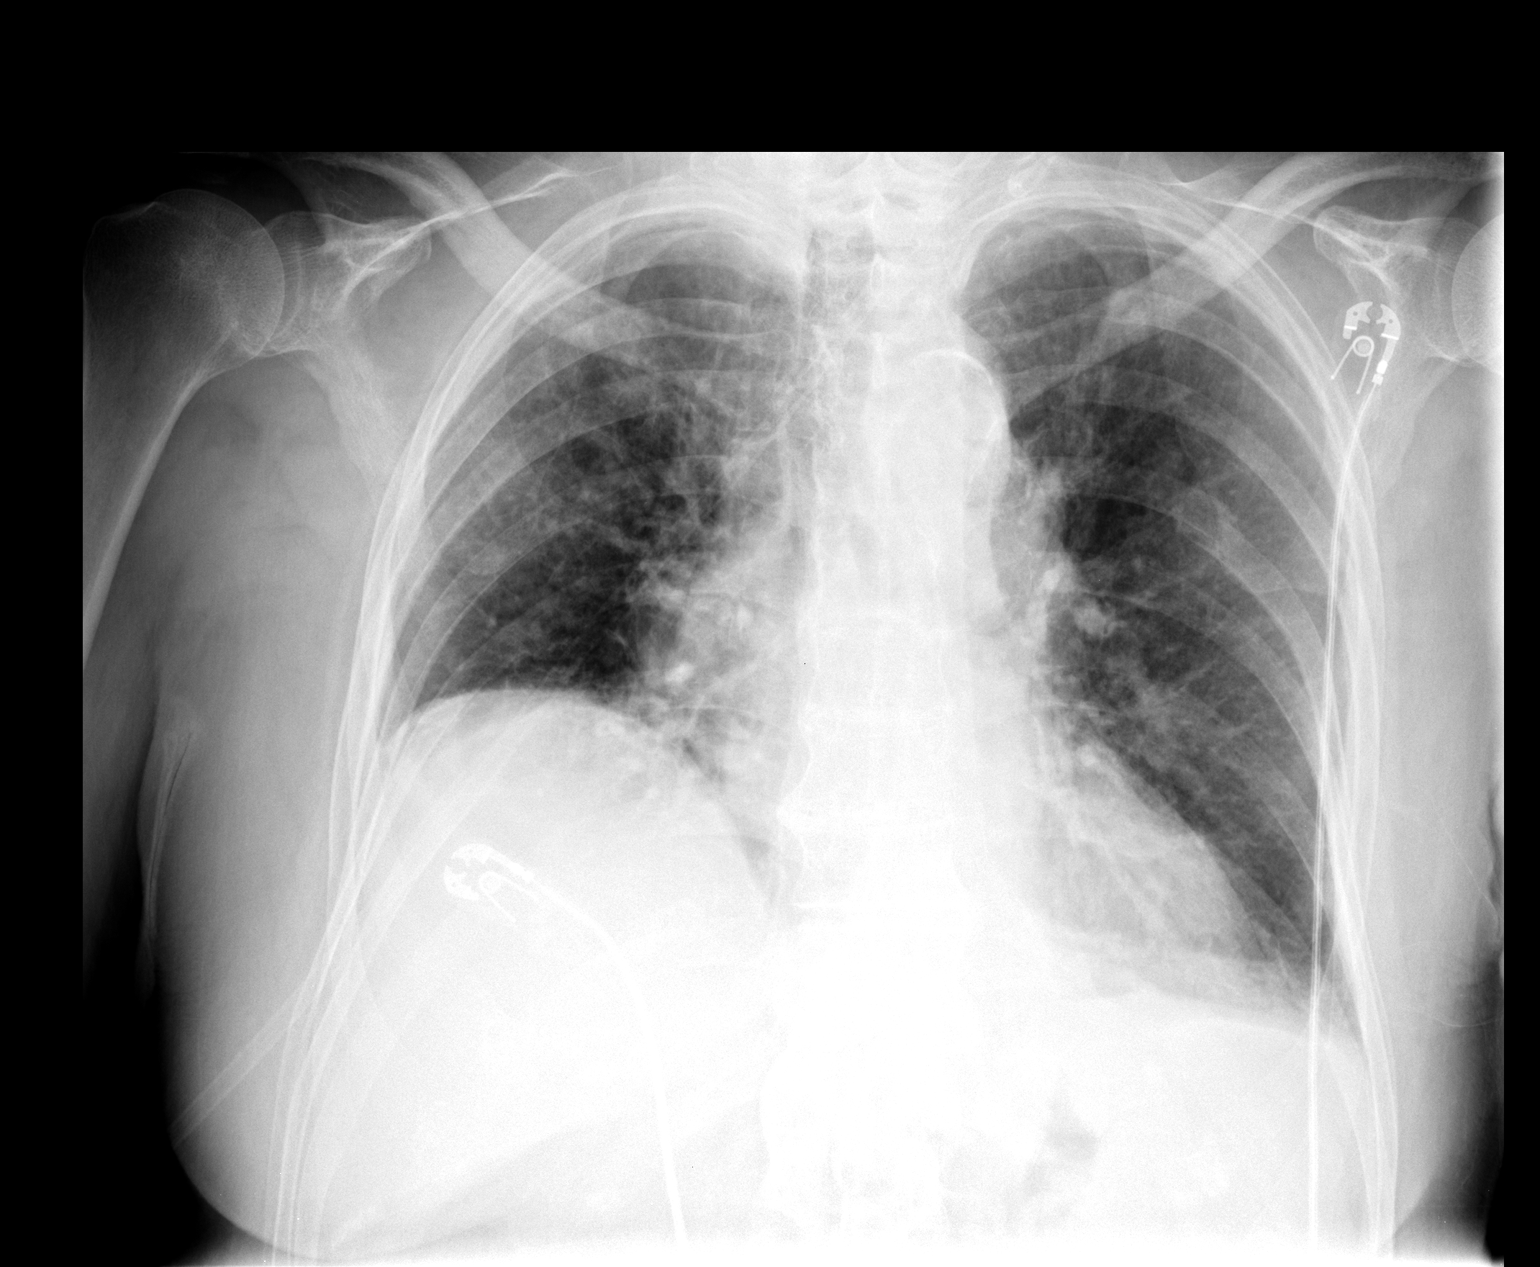

[view not recorded (2 of 2)]
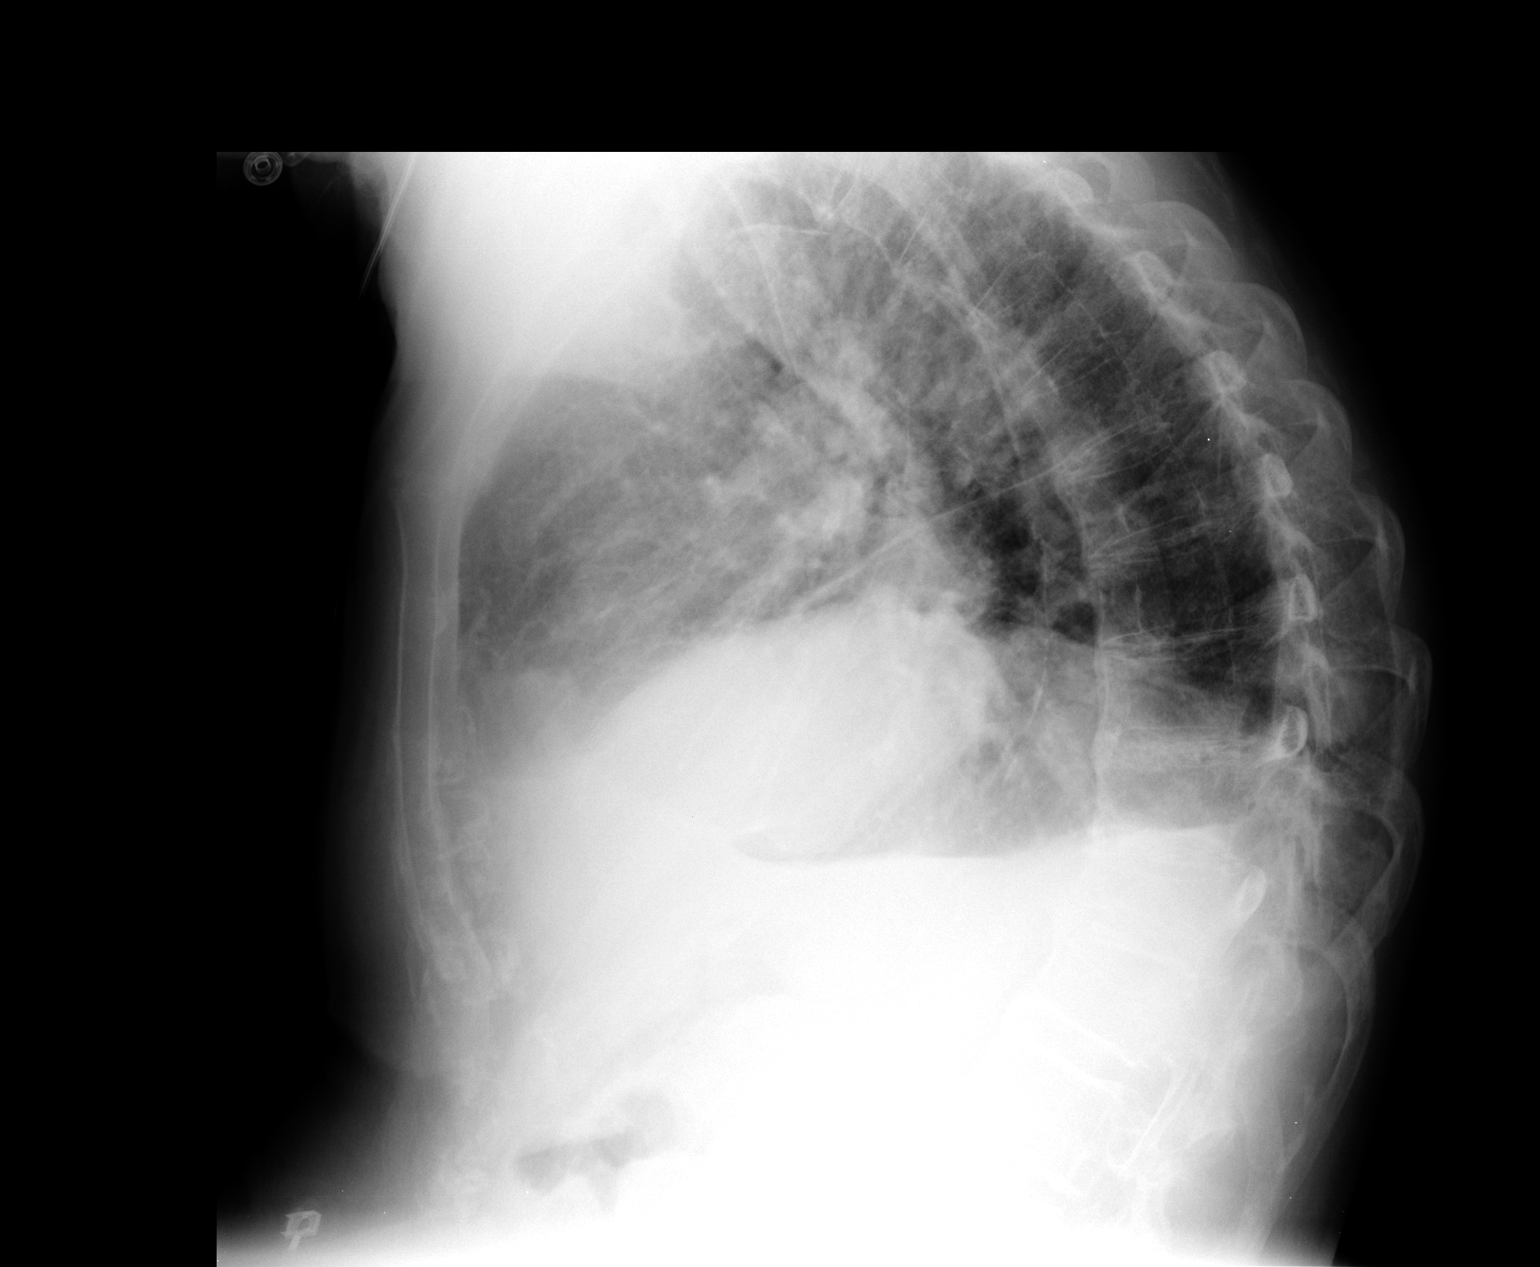

[2 of 2 positions shown; findings below may reference images not displayed]

FINDINGS: There is elevation of the right hemidiaphragm relative to the left.
The patient has a small left pleural effusion with basilar
atelectasis. The right lung is clear. No pneumothorax is identified.
Heart size is upper normal. There is a fracture of the posterior arc
of the left seventh rib which is age indeterminate.
IMPRESSION: Small left pleural effusion and basilar airspace disease, likely
atelectasis.

Age indeterminate left seventh rib fracture.

## 2015-12-19 IMAGING — US US RENAL
1 series · 13 of 25 positions shown · non-contrast
Comparison: None.

CLINICAL DATA: Renal failure.

EXAM:
RENAL/URINARY TRACT ULTRASOUND COMPLETE

[Series 1: us renal · 0.25mm/px · 13 of 31 slices shown]
[im 1/31]
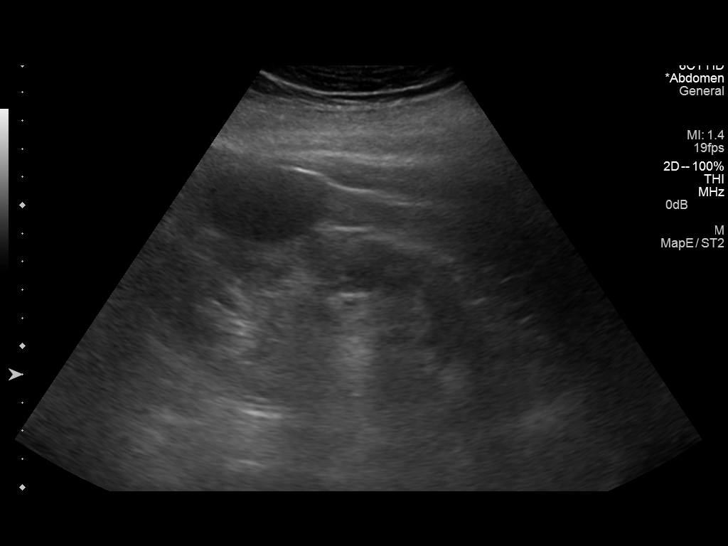
[im 3/31]
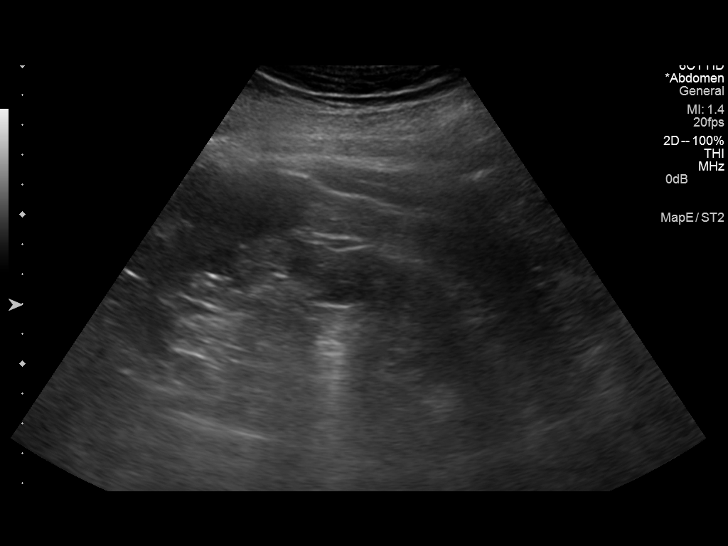
[im 6/31]
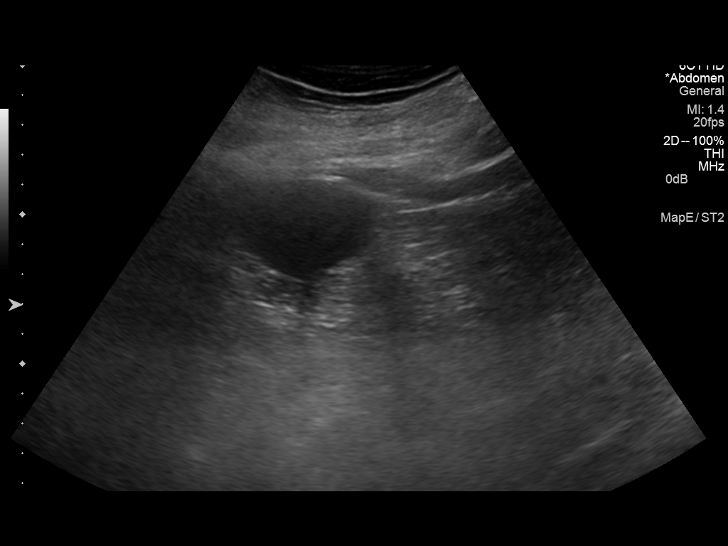
[im 8/31]
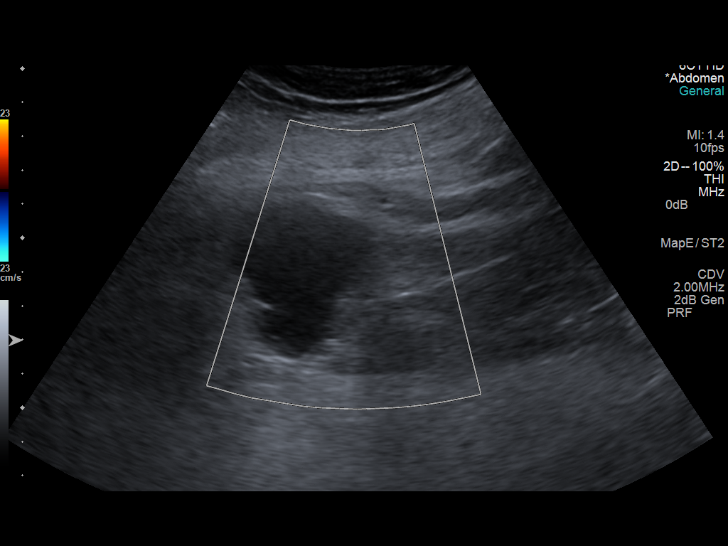
[im 11/31]
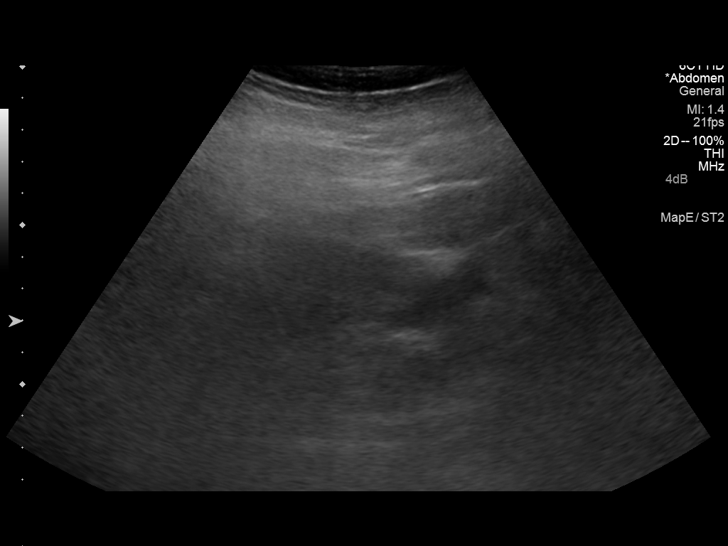
[im 13/31]
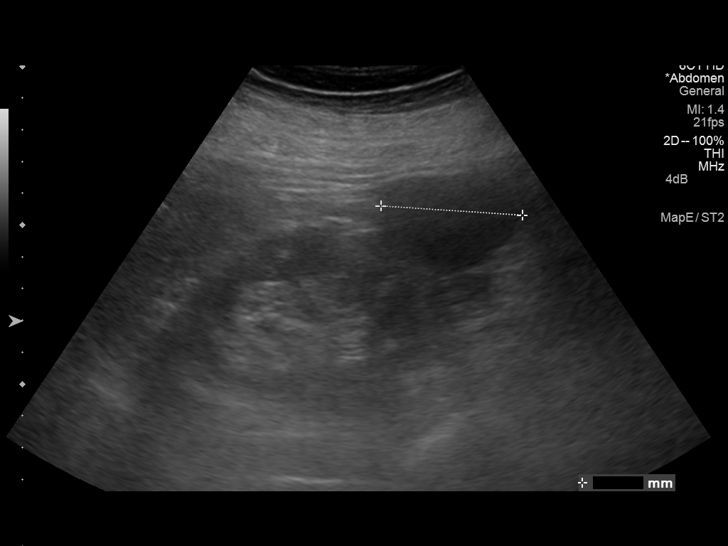
[im 16/31]
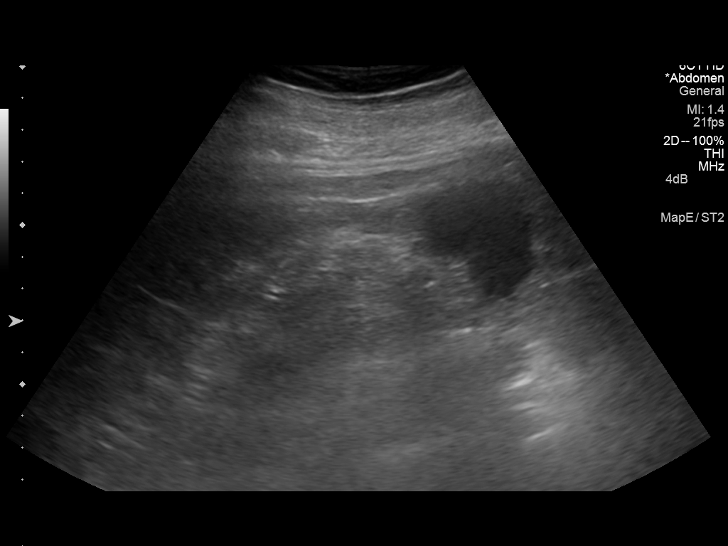
[im 18/31]
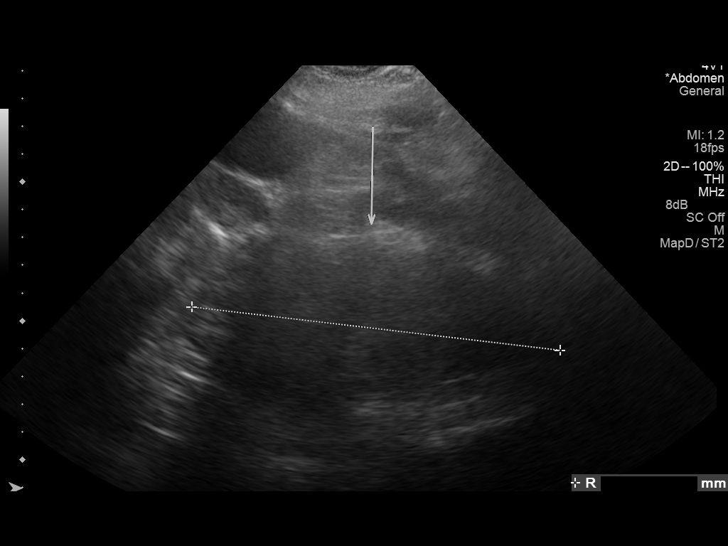
[im 21/31]
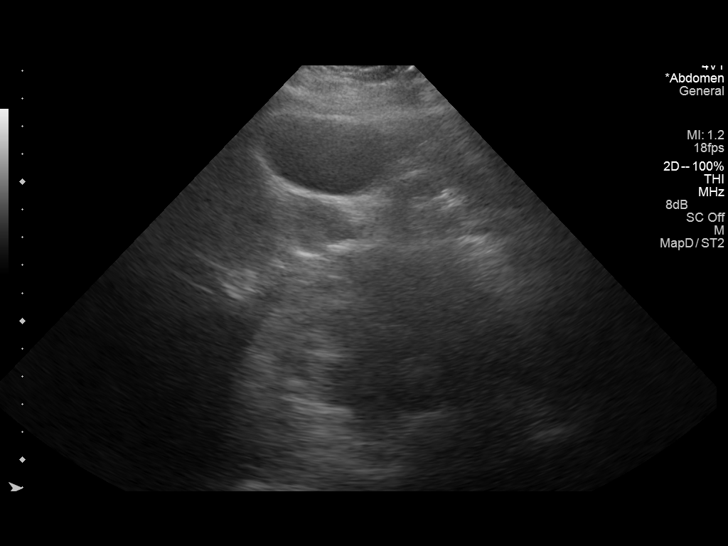
[im 23/31]
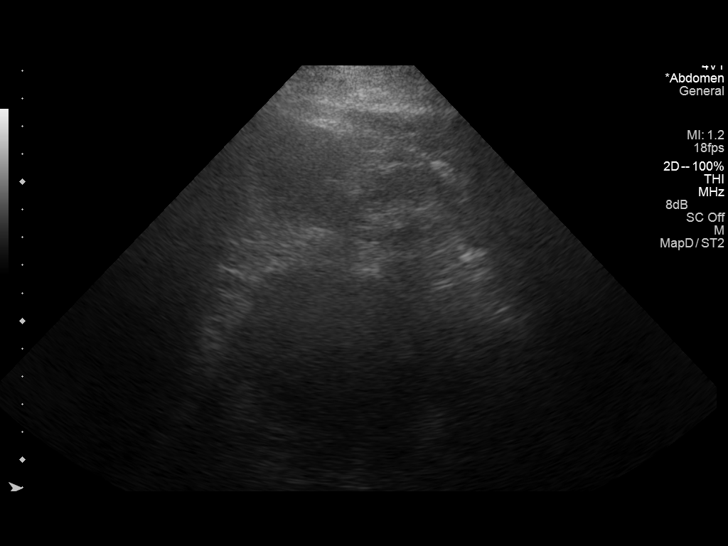
[im 26/31]
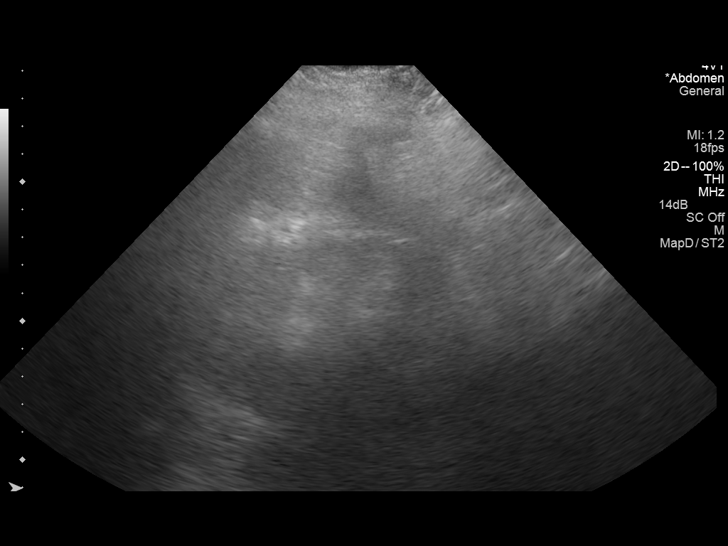
[im 28/31]
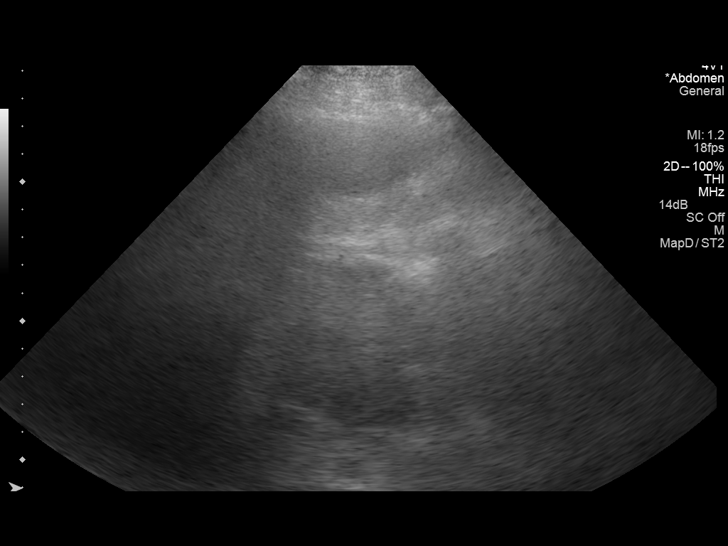
[im 31/31]
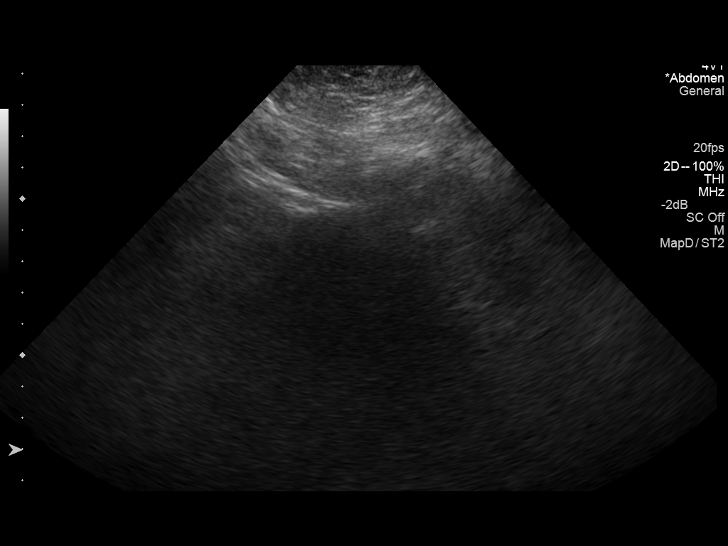

[13 of 25 positions shown; findings below may reference images not displayed]

FINDINGS: Right Kidney:

The right kidney is suboptimally evaluated secondary to poor
sonographic window and diffuse cortical echogenicity. The right
kidney appears normal in size measuring 13.3 cm in length.
Evaluation for hydronephrosis is degraded secondary to poor
sonographic window.

Left Kidney:

The left kidney is normal in size measuring 13.4 cm in length,
however there is mild diffuse thinning of the left-sided renal
cortical parenchyma with associated increased cortical echogenicity.
Note is made of an exophytic slightly lobulated approximately 4.8 x
4.5 x 4.5 cm left-sided renal cyst. No evidence of left-sided
urinary obstruction.

Bladder:

Appears normal for degree of bladder distention.
IMPRESSION: 1. Diffuse increased echogenicity of the bilateral renal parenchyma
suggestive of medical renal disease.
2. No evidence of left-sided urinary obstruction.
3. Nondiagnostic evaluation for right-sided urinary obstruction
secondary to poor sonographic window. Further evaluation could be
performed with noncontrast abdominal CT as clinically indicated.
4. Incidentally noted approximately 4.8 cm partially exophytic
left-sided renal cyst.

## 2015-12-21 IMAGING — CR DG CHEST 1V PORT
1 series · 1 of 1 positions shown · non-contrast
Comparison: 03/22/2014.

CLINICAL DATA: Pneumonia.  Subsequent encounter.

EXAM:
PORTABLE CHEST - 1 VIEW

[portable]
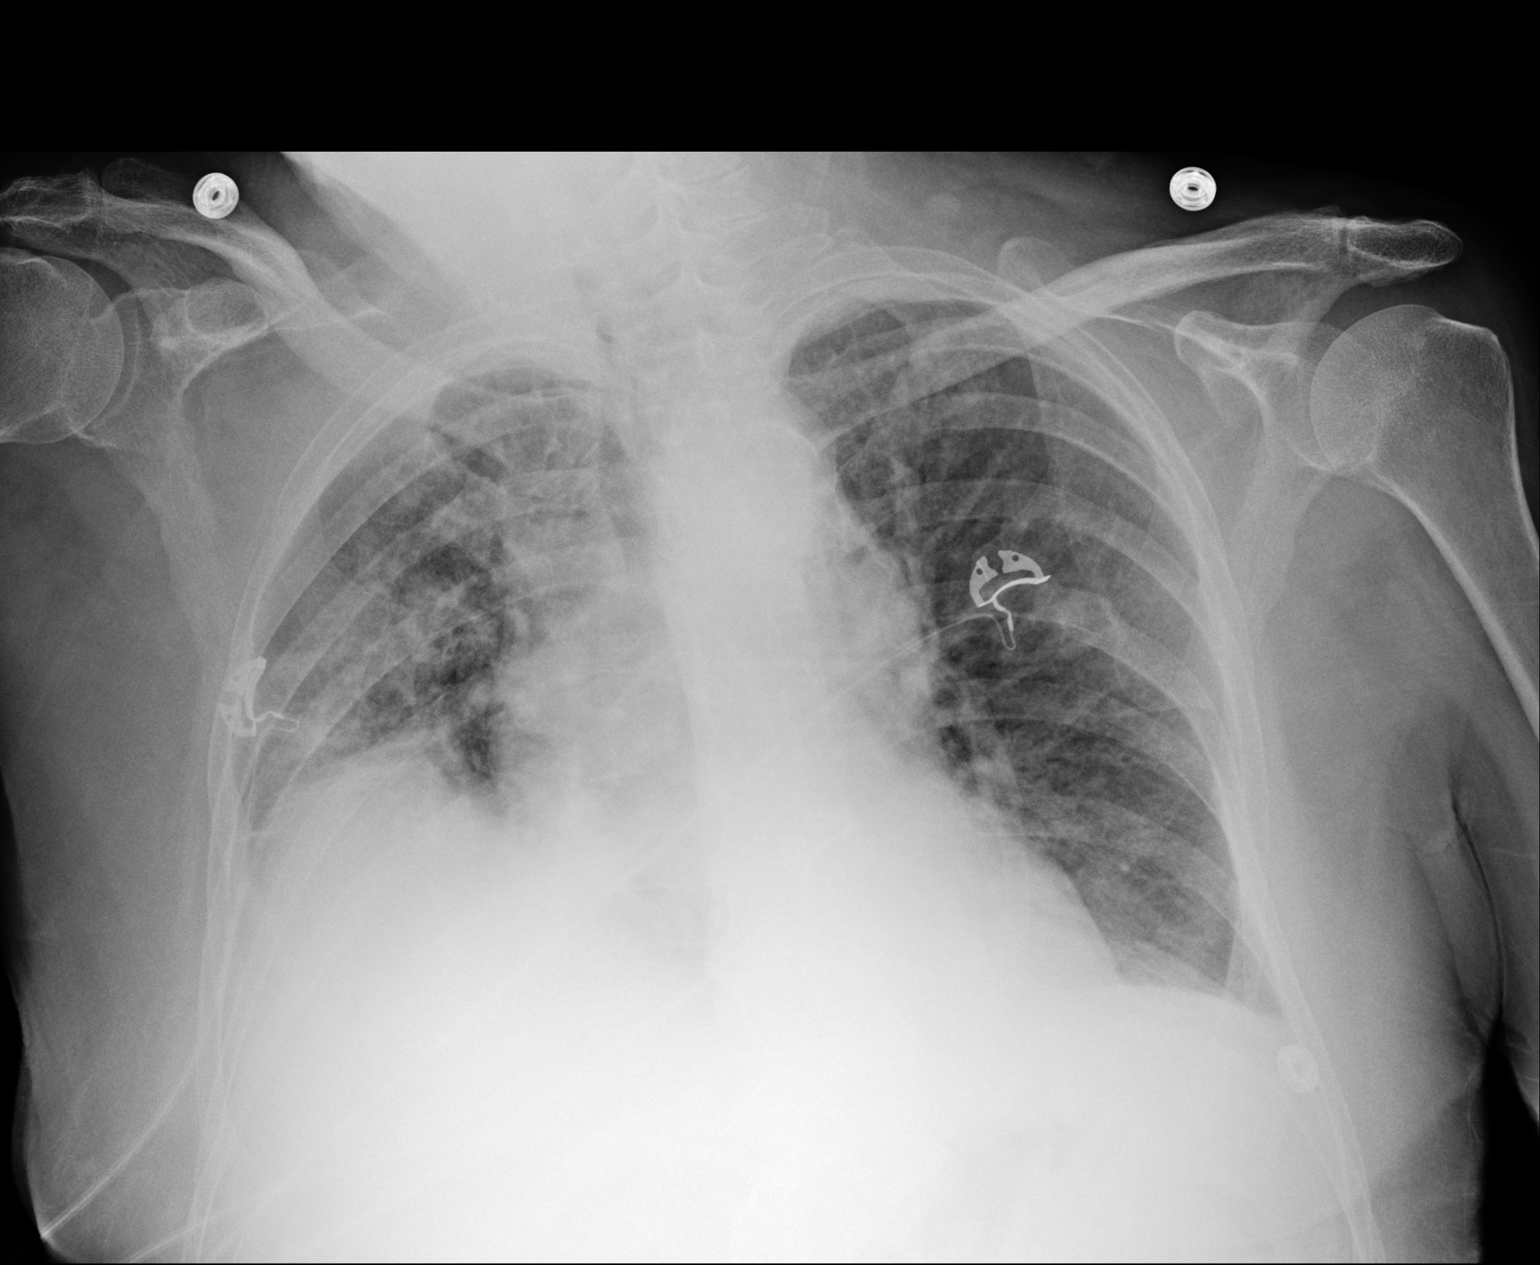

[1 of 1 positions shown; findings below may reference images not displayed]

FINDINGS: There is more volume loss in the RIGHT hemithorax with elevation of
the RIGHT hemidiaphragm. RIGHT midlung consolidation remains present
although difficult to appreciate based on the lower lung volumes.
Cardiomediastinal contours appear unchanged allowing for rotation of
the RIGHT on today's examination. Monitoring leads project over the
chest. LEFT lung appears clear aside from LEFT basilar atelectasis.
IMPRESSION: 1. Lower lung volumes on the RIGHT with elevation of the RIGHT
hemidiaphragm.
2. Persistent airspace disease in the RIGHT mid lung, compatible
with pneumonia. Asymmetric/atypical edema may also be present.
